# Patient Record
Sex: Male | Born: 2000 | Race: Black or African American | Hispanic: No | Marital: Single | Smoking: Never smoker
Health system: Southern US, Community
[De-identification: ages and names within clinical notes are randomized; demographics above are authoritative.]

## PROBLEM LIST (undated history)

## (undated) DIAGNOSIS — G4730 Sleep apnea, unspecified: Secondary | ICD-10-CM

## (undated) DIAGNOSIS — J45909 Unspecified asthma, uncomplicated: Secondary | ICD-10-CM

## (undated) DIAGNOSIS — H5210 Myopia, unspecified eye: Secondary | ICD-10-CM

## (undated) DIAGNOSIS — R6250 Unspecified lack of expected normal physiological development in childhood: Secondary | ICD-10-CM

## (undated) DIAGNOSIS — E301 Precocious puberty: Secondary | ICD-10-CM

## (undated) DIAGNOSIS — J302 Other seasonal allergic rhinitis: Secondary | ICD-10-CM

## (undated) DIAGNOSIS — E669 Obesity, unspecified: Secondary | ICD-10-CM

## (undated) DIAGNOSIS — R7303 Prediabetes: Secondary | ICD-10-CM

## (undated) DIAGNOSIS — T7840XA Allergy, unspecified, initial encounter: Secondary | ICD-10-CM

## (undated) DIAGNOSIS — J45901 Unspecified asthma with (acute) exacerbation: Secondary | ICD-10-CM

## (undated) DIAGNOSIS — F800 Phonological disorder: Secondary | ICD-10-CM

## (undated) DIAGNOSIS — Z734 Inadequate social skills, not elsewhere classified: Secondary | ICD-10-CM

## (undated) HISTORY — DX: Allergy, unspecified, initial encounter: T78.40XA

## (undated) HISTORY — DX: Precocious puberty: E30.1

## (undated) HISTORY — DX: Inadequate social skills, not elsewhere classified: Z73.4

## (undated) HISTORY — DX: Unspecified lack of expected normal physiological development in childhood: R62.50

## (undated) HISTORY — DX: Sleep apnea, unspecified: G47.30

## (undated) HISTORY — DX: Obesity, unspecified: E66.9

## (undated) HISTORY — DX: Phonological disorder: F80.0

## (undated) HISTORY — DX: Unspecified asthma with (acute) exacerbation: J45.901

## (undated) HISTORY — PX: ADENOIDECTOMY: SUR15

## (undated) HISTORY — DX: Myopia, unspecified eye: H52.10

## (undated) HISTORY — PX: TONSILECTOMY, ADENOIDECTOMY, BILATERAL MYRINGOTOMY AND TUBES: SHX2538

---
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
## 2020-01-01 ENCOUNTER — Telehealth (INDEPENDENT_AMBULATORY_CARE_PROVIDER_SITE_OTHER): Payer: Medicaid Other | Admitting: Primary Care

## 2020-01-01 ENCOUNTER — Telehealth: Payer: Self-pay | Admitting: Primary Care

## 2020-01-01 DIAGNOSIS — E669 Obesity, unspecified: Secondary | ICD-10-CM | POA: Diagnosis not present

## 2020-01-01 DIAGNOSIS — G4710 Hypersomnia, unspecified: Secondary | ICD-10-CM

## 2020-01-01 DIAGNOSIS — G4733 Obstructive sleep apnea (adult) (pediatric): Secondary | ICD-10-CM

## 2020-01-01 NOTE — Telephone Encounter (Signed)
Please order patient a chip strap Needs follow-up in 4 weeks televisit with download

## 2020-01-01 NOTE — Progress Notes (Signed)
Virtual Visit via Telephone Note  I connected with Bob [name] on 01/01/20 at 11:30 AM EDT by telephone and verified that I am speaking with the correct person using two identifiers.  Location: Patient: Home Provider: Office   I discussed the limitations, risks, security and privacy concerns of performing an evaluation and management service by telephone and the availability of in person appointments. I also discussed with the patient that there may be a patient responsible charge related to this service. The patient expressed understanding and agreed to proceed.   History of Present Illness: 19 year old male, never smoked. PMH significant for obstructive sleep apnea, mild intermittent asthma and allergies. Patient of Dr. Craige Cotta, last seen on 07/02/20. He has had difficulty with BIPAP mask fit and air leaks. Pressure adjusted to Max IPAP 16, min EPAP 10 CM H20, ps 4CM H20. Ordered for mask re-fitting. Added modafinil 100mg  daily for hypersomnia with OSA. Continue prn albuterol. floanse and zyrtec. Strongly encourage weight loss, given contact information for healthy weight and wellness.   01/01/2020 Patient contacted today for virtual visit. He just received nasal pillow mask 3 weeks ago. Previously was using full face mask, changed d/t air leaks. He is compliant with BIPAP wearing it 29/30 days. He sleeps well at night, his mask may occasionally come off at night but his mother reports that most of the time he is wearing it when he is sleeping. He is tolerating provigil 100mg  once daily. His mother reports improvement in daytime fatigue. He had lost some weight recently but gained it back. He did see healthy weight and wellness. He has not had any shortness of breath or wheezing. He has not needed to use albuterol rescue inhaler.   [facility] download 12/02/19-12/31/19: Usage 29/30 days; 83% > 4 hours Average time used 5 hours 56 mins Pressure Max IPAP 16cm h20, Min EPAP 11cm h30, PS 4 Airleaks  median 35.3L/min (95% 103.09 L/min) Apnea central 0,1, obstructive 9.7, unknown 13 AHI 31.1   Observations/Objective:  - Spoke with mother on video/televisit   Sleep reports:  PSG 03/27/13 >> AHI 6.3  Auto Bipap 06/02/19 to 07/01/19 >> used on 26 of 30 nights with average 3 hrs 51 min.  Average AHI 47.7 with median Bipap 15/11 and 95 th percentile Bipap 16/12 cm H2O.  Air leak  Assessment and Plan:  OSA: - He is 100% compliant with BIPAP use, tolerating nasal mask. Still experiencing large amount of air leaks. He is still having moderate amount of events but overall it is improved from his originally sleep study in 2020 that showed his AHI was 85.3/hr  - Recommend adding chin strap - Pressure  Max IPAP 16, Min EPAP 11, PS 4 cm h20; AHI 31.1/hr  - No pressure changes today, will follow-up in 4 weeks after he gets chin strap to use with nasal pillow mask   Hypersomnolence with OSA: - Improved - Continue Provigil 100mg  qd  Obesity: - Continue to encourage weight loss  - Recommend patient return to healthy weight and wellness   Follow Up Instructions:   - FU in 4 weeks with download  I discussed the assessment and treatment plan with the patient. The patient was provided an opportunity to ask questions and all were answered. The patient agreed with the plan and demonstrated an understanding of the instructions.   The patient was advised to call back or seek an in-person evaluation if the symptoms worsen or if the condition fails to improve as anticipated.  I provided 22 minutes of non-face-to-face time during this encounter.   Martyn Ehrich, NP

## 2020-01-01 NOTE — Patient Instructions (Addendum)
Recommendations: Continue BIPAP every night for minimum 4-6 hours or more Continue Provigil 100mg  daily for hypersomnolence   Orders: Chin strap to use with nasal pillow mask  Follow-up: 4 weeks televisit with download

## 2020-01-01 NOTE — Progress Notes (Signed)
Reviewed and agree with assessment/plan.   Neria Procter, MD [facility] 01/01/2020, 1:45 PM Pager:  [phone]  

## 2020-01-03 ENCOUNTER — Other Ambulatory Visit: Payer: Self-pay | Admitting: Pediatrics

## 2020-01-03 DIAGNOSIS — K219 Gastro-esophageal reflux disease without esophagitis: Secondary | ICD-10-CM

## 2020-01-14 ENCOUNTER — Other Ambulatory Visit: Payer: Self-pay

## 2020-01-14 MED ORDER — MODAFINIL 100 MG PO TABS: 100.0000 mg | ORAL_TABLET | Freq: Every day | ORAL | 5 refills | Status: DC

## 2020-01-14 NOTE — Telephone Encounter (Signed)
Patient is needing refill on Provigil 100 mg.  Last OV/video visit -01/01/20 with BW Told to continue on medication. Last refilled on  07/03/19  Beth please advise.

## 2020-01-24 NOTE — Telephone Encounter (Signed)
Order placed for chin strap.  Attempted to call pt to get f/u appt scheduled but unable to reach and unable to leave a VM. Routing to front desk to help out with getting appt scheduled.

## 2020-01-28 NOTE — Telephone Encounter (Signed)
Attempted to call pt to schedule f/u - vm not set up - pr

## 2020-02-04 NOTE — Telephone Encounter (Signed)
Called and scheduled patient with Beth on 02/29/2020-pr

## 2020-02-18 ENCOUNTER — Telehealth: Payer: Self-pay | Admitting: Pediatrics

## 2020-02-18 NOTE — Telephone Encounter (Signed)
I spoke with mom: they have lost Medicaid care and need Bob [name]'s ID number in order to be seen at [facility]. Mom does not remember changing Bob [name]'s Medicaid to a managed care plan. I gave her most recent Medicaid number that we have on file, but encouraged her to call Medicaid asap.

## 2020-02-18 NOTE — Telephone Encounter (Signed)
Mother wanting to talk to provider about the referral that had been made, I was unclear what she was asking. We may contact the mother at [phone] with more information.

## 2020-02-18 NOTE — Telephone Encounter (Signed)
I tried contacting mom in regards to a referral but lvm. There is no referral that was placed for me to complete the last referral placed is for [facility]. Baxter Hire would you be able to assist this mom in regards to that referral?

## 2020-02-29 ENCOUNTER — Ambulatory Visit: Payer: Medicaid Other | Admitting: Primary Care

## 2020-02-29 ENCOUNTER — Other Ambulatory Visit: Payer: Self-pay

## 2020-02-29 ENCOUNTER — Encounter: Payer: Self-pay | Admitting: Primary Care

## 2020-02-29 VITALS — BP 130/86 | HR 78 | Temp 98.0°F | Ht 66.0 in | Wt 355.6 lb

## 2020-02-29 DIAGNOSIS — R400 Somnolence: Secondary | ICD-10-CM | POA: Diagnosis not present

## 2020-02-29 DIAGNOSIS — G4733 Obstructive sleep apnea (adult) (pediatric): Secondary | ICD-10-CM

## 2020-02-29 NOTE — Assessment & Plan Note (Signed)
>>  ASSESSMENT AND PLAN FOR DAYTIME SOMNOLENCE WRITTEN ON 02/29/2020  2:35 PM BY WALSH, ALMARIE ORN, NP  - Patient continues to have moderate daytimes fatigue despite compliance with BIPAP use. [place] 10/24 today. Recommend BIPAP titration and continue Modafinil  100mg  daily.

## 2020-02-29 NOTE — Progress Notes (Signed)
 @Patient ID: Bob [name], male    DOB: 07/07/2001, 19 y.o.   MRN: [id number]  Chief Complaint  Patient presents with  . Follow-up    still snoring, leg swelling    Referring provider: No ref. provider found  HPI: 19 year old male, never smoked. PMH significant for obstructive sleep apnea, mild intermittent asthma and allergies. Patient of Dr. Sood, last seen on 07/02/20. He has had difficulty with BIPAP mask fit and air leaks. Pressure adjusted to Max IPAP 16, min EPAP 10 CM H20, ps 4CM H20. Ordered for mask re-fitting. Added modafinil 100mg daily for hypersomnia with OSA. Continue prn albuterol. floanse and zyrtec. Strongly encourage weight loss, given contact information for healthy weight and wellness.   Previous [facility] pulmonary encounters:  01/01/2020 Patient contacted today for virtual visit. He just received nasal pillow mask 3 weeks ago. Previously was using full face mask, changed d/t air leaks. He is compliant with BIPAP wearing it 29/30 days. He sleeps well at night, his mask may occasionally come off at night but his mother reports that most of the time he is wearing it when he is sleeping. He is tolerating provigil 100mg once daily. His mother reports improvement in daytime fatigue. He had lost some weight recently but gained it back. He did see healthy weight and wellness. He has not had any shortness of breath or wheezing. He has not needed to use albuterol rescue inhaler.  Airview download 12/02/19-12/31/19: Usage 29/30 days; 83% > 4 hours Average time used 5 hours 56 mins Pressure Max IPAP 16cm h20, Min EPAP 11cm h30, PS 4 Airleaks median 35.3L/min (95% 103.09 L/min) Apnea central 0,1, obstructive 9.7, unknown 13 AHI 31.1   02/29/2020-Interim history Patient presents today for a 2-month follow-up OSA/asthma. He received new BIPAP mask 1-2 months ago, he was never sent chin strap. States that he was at [facility] [place] [facility] store and decided he wanted to try full face mask. He is  still taking Provigil 100mg at 6am.  He works as a seamstress, tasks include lifting boxes and labeling. He will occasionally take a nap. He does not currently have PCP, has started filling paper work. Complains of left knee pain. Epworth 10/24.   Airview download 01/30/20- 02/28/20: 26/30 days used (87%); 18 days (60%) > 4 hours Average usage 4 hours 54 mins Pressure Max IPAP 16cm h20, Min EPAP 11cm h30, PS 4 Airleaks 107.9L/min AHI 40.4    Allergies  Allergen Reactions  . Black Walnut Flavor Other (See Comments)    Asphyxiation    Immunization History  Administered Date(s) Administered  . DTaP 09/13/2000, 11/25/2000, 02/08/2001, 10/25/2001, 09/30/2004  . HPV 9-valent 08/07/2014  . HPV Quadrivalent 08/31/2011, 11/17/2012  . Hepatitis A 12/14/2005, 02/21/2007  . Hepatitis B 06/28/2001, 02/08/2001, 10/12/2005  . HiB (PRP-OMP) 09/13/2000, 11/25/2000, 02/08/2001, 10/25/2001  . IPV 09/13/2000, 11/25/2000, 08/02/2001, 09/30/2004  . Influenza Nasal 08/31/2011  . Influenza Split 05/29/2005, 05/12/2006, 05/13/2007, 04/16/2008, 04/22/2009, 04/28/2010, 05/02/2019  . Influenza,inj,Quad PF,6+ Mos 08/22/2015, 04/01/2016, 07/05/2017  . Influenza,inj,quad, With Preservative 08/07/2014  . MMR 08/02/2001, 09/30/2004  . Meningococcal Conjugate 08/31/2011, 01/04/2017  . Pneumococcal Conjugate-13 09/13/2000, 11/25/2000, 02/08/2001, 08/02/2002  . Tdap 08/31/2011  . Varicella 08/02/2001, 12/14/2005    Past Medical History:  Diagnosis Date  . Asthma 2007  . Asthma exacerbation 11/17/12  . Development delay    Karyotype normal, fragile X normal  . Early puberty, male 2009   at 7 years, familial  . Impaired speech articulation 2011  .   Inadequate social skills 2011  . Myopia 2010  . 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 Obesity 2010  . Seasonal allergies 2007    Tobacco History: Social History   Tobacco Use  Smoking Status Never Smoker  Smokeless Tobacco  Never Used   Counseling given: Not Answered   Outpatient Medications Prior to Visit  Medication Sig Dispense Refill  . cetirizine (ZYRTEC) 10 MG tablet TAKE 1 TABLET BY MOUTH EVERY NIGHT AT BEDTIME AS NEEDED FOR ALLERGIES 31 tablet 10  . modafinil (PROVIGIL) 100 MG tablet Take 1 tablet (100 mg total) by mouth daily. 30 tablet 5  . Multiple Vitamin (MULTIVITAMIN WITH MINERALS) TABS tablet Take 1 tablet by mouth daily.    . famotidine (PEPCID) 20 MG tablet Please take once a day for 2 weeks, then every other day for a week, then stop 45 tablet 0  . fluticasone (FLONASE) 50 MCG/ACT nasal spray Place 1 spray into both nostrils daily. (Patient not taking: Reported on 02/29/2020) 16 g 10   No facility-administered medications prior to visit.    Review of Systems  Review of Systems  Constitutional: Positive for fatigue.  Respiratory: Negative.   Musculoskeletal: Positive for arthralgias.    Physical Exam  BP 130/86 (BP Location: Left Arm, Cuff Size: Large)   Pulse 78   Temp 98 F (36.7 C) (Temporal)   Ht 5' 6" (1.676 m)   Wt (!) 355 lb 9.6 oz (161.3 kg)   SpO2 99% Comment: Room air  BMI 57.40 kg/m  Physical Exam Constitutional:      General: He is not in acute distress.    Appearance: Normal appearance. He is obese. He is not ill-appearing.  HENT:     Head: Normocephalic and atraumatic.     Mouth/Throat:     Mouth: Mucous membranes are moist.     Pharynx: Oropharynx is clear.     Comments: Mallampati class III Cardiovascular:     Rate and Rhythm: Normal rate and regular rhythm.  Pulmonary:     Effort: Pulmonary effort is normal.     Breath sounds: Normal breath sounds.  Musculoskeletal:        General: No deformity. Normal range of motion.  Neurological:     General: No focal deficit present.     Mental Status: He is alert and oriented to person, place, and time. Mental status is at baseline.  Psychiatric:        Mood and Affect: Mood normal.        Behavior: Behavior  normal.        Thought Content: Thought content normal.        Judgment: Judgment normal.      Lab Results:  CBC    Component Value Date/Time   WBC 7.0 06/22/2019 1825   RBC 4.88 06/22/2019 1825   HGB 13.9 06/22/2019 1825   HCT 43.1 06/22/2019 1825   PLT 249 06/22/2019 1825   MCV 88.3 06/22/2019 1825   MCH 28.5 06/22/2019 1825   MCHC 32.3 06/22/2019 1825   RDW 13.4 06/22/2019 1825   LYMPHSABS 2.2 06/22/2019 1825   MONOABS 0.6 06/22/2019 1825   EOSABS 0.1 06/22/2019 1825   BASOSABS 0.0 06/22/2019 1825    BMET    Component Value Date/Time   NA 140 06/22/2019 1825   K 3.8 06/22/2019 1825   CL 105 06/22/2019 1825   CO2 24 06/22/2019 1825   GLUCOSE 91 06/22/2019 1825   BUN   10 06/22/2019 1825   CREATININE 0.98 06/22/2019 1825   CALCIUM 9.6 06/22/2019 1825   GFRNONAA >60 06/22/2019 1825   GFRAA >60 06/22/2019 1825    BNP    Component Value Date/Time   BNP 17.3 06/22/2019 1822    ProBNP No results found for: PROBNP  Imaging: No results found.   Assessment & Plan:   Obstructive sleep apnea - Patient is 87% compliant with CPAP use, experiencing large amount of airleaks. He has tried nasal and full face mask. Airview download showed that patient is still having significant events despite BIPAP use, recommend in-lab titration study to readjust pressure/mask fit  - Current Pressure Max IPAP 16cm h20, Min EPAP 11cm h30, PS 4 - AHI 40.4  - Orders: CPAP titration study re: severe OSA - Encourage patient continue to work on weight loss efforts - Follow-up: 3-4 months with Dr. Halford Chessman  Daytime somnolence - Patient continues to have moderate daytimes fatigue despite compliance with BIPAP use. Epworth 10/24 today. Recommend BIPAP titration and continue Modafinil 166m daily.    EMartyn Ehrich NP 02/29/2020

## 2020-02-29 NOTE — Assessment & Plan Note (Signed)
-   Patient continues to have moderate daytimes fatigue despite compliance with BIPAP use. Epworth 10/24 today. Recommend BIPAP titration and continue Modafinil 100mg  daily.

## 2020-02-29 NOTE — Assessment & Plan Note (Addendum)
-   Patient is 87% compliant with CPAP use, experiencing large amount of airleaks. He has tried nasal and full face mask. Airview download showed that patient is still having significant events despite BIPAP use, recommend in-lab titration study to readjust pressure/mask fit  - Current Pressure Max IPAP 16cm h20, Min EPAP 11cm h30, PS 4 - AHI 40.4  - Orders: CPAP titration study re: severe OSA - Encourage patient continue to work on weight loss efforts - Follow-up: 3-4 months with Dr. Craige Cotta

## 2020-02-29 NOTE — Patient Instructions (Addendum)
Nice seeing you today [name] download showed that you are still having significant events despite BIPAP use, recommend in-lab CPAP titration study to readjust pressure/mask fit   Orders: CPAP titration study re: severe OSA  Recommendations: Continue Modafinil 100mg  daily  Continue to work on weight loss efforts  Follow-up: 3-4 months with Dr.     Sleep Apnea Sleep apnea is a condition in which breathing pauses or becomes shallow during sleep. Episodes of sleep apnea usually last 10 seconds or longer, and they may occur as many as 20 times an hour. Sleep apnea disrupts your sleep and keeps your body from getting the rest that it needs. This condition can increase your risk of certain health problems, including:  Heart attack.  Stroke.  Obesity.  Diabetes.  Heart failure.  Irregular heartbeat. What are the causes? There are three kinds of sleep apnea:  Obstructive sleep apnea. This kind is caused by a blocked or collapsed airway.  Central sleep apnea. This kind happens when the part of the brain that controls breathing does not send the correct signals to the muscles that control breathing.  Mixed sleep apnea. This is a combination of obstructive and central sleep apnea. The most common cause of this condition is a collapsed or blocked airway. An airway can collapse or become blocked if:  Your throat muscles are abnormally relaxed.  Your tongue and tonsils are larger than normal.  You are overweight.  Your airway is smaller than normal. What increases the risk? You are more likely to develop this condition if you:  Are overweight.  Smoke.  Have a smaller than normal airway.  Are elderly.  Are male.  Drink alcohol.  Take sedatives or tranquilizers.  Have a family history of sleep apnea. What are the signs or symptoms? Symptoms of this condition include:  Trouble staying asleep.  Daytime sleepiness and  tiredness.  Irritability.  Loud snoring.  Morning headaches.  Trouble concentrating.  Forgetfulness.  Decreased interest in sex.  Unexplained sleepiness.  Mood swings.  Personality changes.  Feelings of depression.  Waking up often during the night to urinate.  Dry mouth.  Sore throat. How is this diagnosed? This condition may be diagnosed with:  A medical history.  A physical exam.  A series of tests that are done while you are sleeping (sleep study). These tests are usually done in a sleep lab, but they may also be done at home. How is this treated? Treatment for this condition aims to restore normal breathing and to ease symptoms during sleep. It may involve managing health issues that can affect breathing, such as high blood pressure or obesity. Treatment may include:  Sleeping on your side.  Using a decongestant if you have nasal congestion.  Avoiding the use of depressants, including alcohol, sedatives, and narcotics.  Losing weight if you are overweight.  Making changes to your diet.  Quitting smoking.  Using a device to open your airway while you sleep, such as: ? An oral appliance. This is a custom-made mouthpiece that shifts your lower jaw forward. ? A continuous positive airway pressure (CPAP) device. This device blows air through a mask when you breathe out (exhale). ? A nasal expiratory positive airway pressure (EPAP) device. This device has valves that you put into each nostril. ? A bi-level positive airway pressure (BPAP) device. This device blows air through a mask when you breathe in (inhale) and breathe out (exhale).  Having surgery if other treatments do not  work. During surgery, excess tissue is removed to create a wider airway. It is important to get treatment for sleep apnea. Without treatment, this condition can lead to:  High blood pressure.  Coronary artery disease.  In men, an inability to achieve or maintain an erection  (impotence).  Reduced thinking abilities. Follow these instructions at home: Lifestyle  Make any lifestyle changes that your health care provider recommends.  Eat a healthy, well-balanced diet.  Take steps to lose weight if you are overweight.  Avoid using depressants, including alcohol, sedatives, and narcotics.  Do not use any products that contain nicotine or tobacco, such as cigarettes, e-cigarettes, and chewing tobacco. If you need help quitting, ask your health care provider. General instructions  Take over-the-counter and prescription medicines only as told by your health care provider.  If you were given a device to open your airway while you sleep, use it only as told by your health care provider.  If you are having surgery, make sure to tell your health care provider you have sleep apnea. You may need to bring your device with you.  Keep all follow-up visits as told by your health care provider. This is important. Contact a health care provider if:  The device that you received to open your airway during sleep is uncomfortable or does not seem to be working.  Your symptoms do not improve.  Your symptoms get worse. Get help right away if:  You develop: ? Chest pain. ? Shortness of breath. ? Discomfort in your back, arms, or stomach.  You have: ? Trouble speaking. ? Weakness on one side of your body. ? Drooping in your face. These symptoms may represent a serious problem that is an emergency. Do not wait to see if the symptoms will go away. Get medical help right away. Call your local emergency services (911 in the [place] Do not drive yourself to the hospital. Summary  Sleep apnea is a condition in which breathing pauses or becomes shallow during sleep.  The most common cause is a collapsed or blocked airway.  The goal of treatment is to restore normal breathing and to ease symptoms during sleep. This information is not intended to replace advice given to you  by your health care provider. Make sure you discuss any questions you have with your health care provider. Document Revised: 12/20/2018 Document Reviewed: 02/28/2018 Elsevier Patient Education  2020 [employer].

## 2020-03-03 NOTE — Progress Notes (Signed)
Reviewed and agree with assessment/plan.   Coralyn Helling, MD [facility] Pulmonary/Critical Care 03/03/2020, 8:32 AM Pager:  [phone]

## 2020-03-06 NOTE — Telephone Encounter (Signed)
TC to mom, unable to LVM.

## 2020-03-19 ENCOUNTER — Telehealth: Payer: Self-pay | Admitting: Pulmonary Disease

## 2020-03-19 NOTE — Telephone Encounter (Signed)
Assessment & Plan:   Obstructive sleep apnea - Patient is 87% compliant with CPAP use, experiencing large amount of airleaks. He has tried nasal and full face mask. Airview download showed that patient is still having significant events despite BIPAP use, recommend in-lab titration study to readjust pressure/mask fit  - Current Pressure Max IPAP 16cm h20, Min EPAP 11cm h30, PS 4 - AHI 40.4  - Orders: CPAP titration study re: severe OSA - Encourage patient continue to work on weight loss efforts - Follow-up: 3-4 months with Dr. Craige Cotta  Daytime somnolence - Patient continues to have moderate daytimes fatigue despite compliance with BIPAP use. Epworth 10/24 today. Recommend BIPAP titration and continue Modafinil 100mg  daily.    Called and spoke with [facility] in regards to OV note from [facility] and he verbalized understanding. Nothing further needed.

## 2020-03-21 ENCOUNTER — Other Ambulatory Visit: Payer: Medicaid Other

## 2020-03-21 ENCOUNTER — Other Ambulatory Visit: Payer: Self-pay

## 2020-03-21 DIAGNOSIS — Z20822 Contact with and (suspected) exposure to covid-19: Secondary | ICD-10-CM

## 2020-03-23 LAB — NOVEL CORONAVIRUS, NAA: SARS-CoV-2, NAA: NOT DETECTED

## 2020-03-25 ENCOUNTER — Ambulatory Visit (HOSPITAL_BASED_OUTPATIENT_CLINIC_OR_DEPARTMENT_OTHER): Payer: Medicaid Other | Attending: Primary Care | Admitting: Pulmonary Disease

## 2020-03-25 ENCOUNTER — Other Ambulatory Visit: Payer: Self-pay

## 2020-05-16 ENCOUNTER — Encounter: Payer: Self-pay | Admitting: Family Medicine

## 2020-05-16 ENCOUNTER — Ambulatory Visit (INDEPENDENT_AMBULATORY_CARE_PROVIDER_SITE_OTHER): Payer: Medicaid Other | Admitting: Family Medicine

## 2020-05-16 ENCOUNTER — Other Ambulatory Visit: Payer: Self-pay

## 2020-05-16 DIAGNOSIS — L859 Epidermal thickening, unspecified: Secondary | ICD-10-CM | POA: Diagnosis present

## 2020-05-16 HISTORY — DX: Epidermal thickening, unspecified: L85.9

## 2020-05-16 NOTE — Progress Notes (Addendum)
SUBJECTIVE:   CHIEF COMPLAINT / HPI:   Bob [name] (MRN: [id number]) is a 19 y.o. male with a history of asthma, OSA, GERD, and seasonal allergies who presents today to establish care and for evaluation of dry skin and discoloration of the bilateral extremities. He was accompanied today with his grandmother and niece.  Dry skin and discoloration of bilateral extremities Patient reports having very dry legs with hyperpigmentation for the last 8-9 months. He also has dry skin on his arms, hands, and feet. He states that he sometimes picks at it due to it being flaky. He has tried [employer] to moisturize the area, and the flakiness improves slightly, but it ultimately comes back after stopping use. He has also tried triamcinolone that his father has with minimal improvement. He denies any pain. He denies any bleeding unless he picks at it too much. It otherwise does not bother him at all.  PERTINENT  PMH / PSH:   He has had asthma since elementary school, and it is controlled with albuterol inhaler prn. He has had sleep apnea since high school, and it is controlled with BPAP and provigil, but he is having a sleep study done on 05/20/2020 for further evaluation. He has not had a flare up of GERD since stopping famotidine. He takes one tablet of cetirizine and two sprays of fluticasone daily for allergies. He also has an allergy to black walnuts. He takes vitafusion multivitamin and nature's bounty vitamin D3 daily. He states all of his vaccines are in the [facility] registry. He had an adenoidectomy and tonsillectomy in 2004.  His grandparents have a history of Alzhemier's disease, heart disease, depression/suicide/anxiety, diabetes, HLD, HTN, kidney disease, and stroke. His aunts/uncles have a history of depression/suicide/anxiety.  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 He lives with his mother and  older brother, and his grandmother helps care for him. His family brings him to appointments. He states that he has an advanced directive, and that his mother or grandmother should make medical decisions for him should he be unable to do so. He enjoys playing video games, watching YouTube videos, and reading. He does not use tobacco products, alcohol, or recreational drugs. He denies regular exercise but endorses a diet of low sodium meats and vegetables. He is not sexually active.  OBJECTIVE:   BP 128/64   Pulse (!) 110   Ht 5\' 6"  (1.676 m)   Wt (!) 363 lb (164.7 kg)   SpO2 95%   BMI 58.59 kg/m    PHYSICAL EXAM  GEN: well developed, well-nourished, in NAD HEAD: NCAT, neck supple  EENT:  PERRL, TM clear bilaterally, pink nasal mucosa, MMM without erythema, lesions, or exudates CVS: RRR, normal S1/S2, no murmurs, rubs, gallops RESP: Breathing comfortably on RA, no retractions, wheezes, rhonchi, or crackles ABD: soft, non-tender, no organomegaly or masses SKIN: hyperkeratosis of bilateral extremities; dry skin on arms, hands, and feet EXT: Moves all extremities equally, no lymphedema   ASSESSMENT/PLAN:   Hyperkeratosis Patient has had symptoms for the last 8-9 months with only minimal improvement with intermittent use of Bag Balm and triamcinolone. Counseled patient on continuing to use Bag Balm or try another agent such as petroleum jelly or Aquaphor on a regular basis to improve dry skin and buildup. Can also use on arms, hands, and feet. If no improvement after 4-6 weeks, return for further  evaluation.   Samantha Crimes, Medical Student [facility]  Resident Attestation  I saw and evaluated the patient, performing the key elements of the service.I  personally performed or re-performed the history, physical exam, and medical decision making activities of this service and have verified that the service and findings are accurately documented in the student's note.  I developed the management plan that is described in the medical student's note, and I agree with the content, with my edits above.    Derrel Nip, PGY2

## 2020-05-16 NOTE — Assessment & Plan Note (Signed)
Patient has had symptoms for the last 8-9 months with only minimal improvement with intermittent use of Bag Balm and triamcinolone. Counseled patient on continuing to use Bag Balm or try another agent such as petroleum jelly or Aquaphor on a regular basis to improve dry skin and buildup. Can also use on arms, hands, and feet. If no improvement after 4-6 weeks, return for further evaluation.

## 2020-05-16 NOTE — Patient Instructions (Signed)
It was great seeing you today! We are happy to have you here at our clinic. Your physical looked good. For your dry skin, continue to use the Bag Balm if it works for you. You can also use others such as petroleum jelly or aquaphor to ensure you are getting moisture to your skin. If you have worsening symptoms, please call us back. Have a great rest of your day!

## 2020-05-20 ENCOUNTER — Ambulatory Visit (HOSPITAL_BASED_OUTPATIENT_CLINIC_OR_DEPARTMENT_OTHER): Payer: Medicaid Other | Attending: Primary Care | Admitting: Pulmonary Disease

## 2020-05-20 ENCOUNTER — Other Ambulatory Visit: Payer: Self-pay

## 2020-05-20 DIAGNOSIS — G4733 Obstructive sleep apnea (adult) (pediatric): Secondary | ICD-10-CM | POA: Diagnosis present

## 2020-05-21 ENCOUNTER — Telehealth: Payer: Self-pay | Admitting: Pulmonary Disease

## 2020-05-21 DIAGNOSIS — G4733 Obstructive sleep apnea (adult) (pediatric): Secondary | ICD-10-CM

## 2020-05-21 NOTE — Telephone Encounter (Signed)
Bipap 05/20/20 >> Bipap 30/25 cm H2O >> AHI 12.8.  Didn't need supplemental oxygen.   Please have his DME change his Bipap to 30/25 cm H2O and schedule him for an ROV in 2 months.  If his Bipap is not able to be set that high, then please let me know and we would then need to look into getting him set up with a Trilogy or Astral home ventilator.

## 2020-05-21 NOTE — Procedures (Signed)
    Patient Name: Bob [name], Bob [name] Date: 05/20/2020 Gender: Male D.O.B: Nov 01, 2000 Age (years): 19 Referring Provider: Ames Dura NP Height (inches): 66 Interpreting Physician: Coralyn Helling MD, ABSM Weight (lbs): 363 RPSGT: Shelah Lewandowsky BMI: 59 MRN: [id number] Neck Size: 20.00  CLINICAL INFORMATION The patient is referred for a BiPAP titration to treat sleep apnea.  SLEEP STUDY TECHNIQUE As per the AASM Manual for the Scoring of Sleep and Associated Events v2.3 (April 2016) with a hypopnea requiring 4% desaturations.  The channels recorded and monitored were frontal, central and occipital EEG, electrooculogram (EOG), submentalis EMG (chin), nasal and oral airflow, thoracic and abdominal wall motion, anterior tibialis EMG, snore microphone, electrocardiogram, and pulse oximetry. Bilevel positive airway pressure (BPAP) was initiated at the beginning of the study and titrated to treat sleep-disordered breathing.  MEDICATIONS Medications self-administered by patient taken the night of the study : N/A  RESPIRATORY PARAMETERS Optimal IPAP Pressure (cm): 30 AHI at Optimal Pressure (/hr) 12.8 Optimal EPAP Pressure (cm): 25   Overall Minimal O2 (%): 64.0 Minimal O2 at Optimal Pressure (%): 89.0  SLEEP ARCHITECTURE Start Time: 10:34:29 PM Stop Time: 4:54:24 AM Total Time (min): 379.9 Total Sleep Time (min): 367 Sleep Latency (min): 1.3 Sleep Efficiency (%): 96.6% REM Latency (min): 139.5 WASO (min): 11.6 Stage N1 (%): 6.3% Stage N2 (%): 69.1% Stage N3 (%): 0.0% Stage R (%): 24.7 Supine (%): 85.69 Arousal Index (/hr): 52.8   CARDIAC DATA The 2 lead EKG demonstrated sinus rhythm. The mean heart rate was 76.0 beats per minute. Other EKG findings include: PVCs.  LEG MOVEMENT DATA The total Periodic Limb Movements of Sleep (PLMS) were 0. The PLMS index was 0.0. A PLMS index of <15 is considered normal in adults.  IMPRESSIONS - He was tried on CPAP but continued to have  obstructive respiratory events. - He was changed to Bipap.  He had improvement in his obstructive sleep apnea, but required very high Bipap settings.  Optimal pressure setting for Bipap was 30/25 cm H2O. - He did not require the use of supplemental oxygen during this study.  DIAGNOSIS - Obstructive Sleep Apnea (G47.33)  RECOMMENDATIONS - Trial of BiPAP therapy on 30/25 cm H2O with a Large size [employer] Full Face Mask Simplus mask and heated humidification.  [Electronically signed] 05/21/2020 09:29 AM  Coralyn Helling MD, ABSM Diplomate, American Board of Sleep Medicine   NPI: [id number]

## 2020-05-23 NOTE — Telephone Encounter (Signed)
Called and went over Bipap results per Dr Craige Cotta. All questions answered and patient expressed understanding and agreeable to order being placed to change Bipap settings per Dr Craige Cotta. Order to change Bipap settings to BIPAP ST 30/25 cm H20 with back up rate of 14 per verbal order from Dr Craige Cotta placed. Scheduled office visit with Dr Craige Cotta at the [facility] office for Friday, 08/08/2020 at 11:15am. Patient agreeable to time, date and location. Nothing further needed at this time.

## 2020-05-29 ENCOUNTER — Telehealth: Payer: Self-pay | Admitting: Pulmonary Disease

## 2020-05-29 NOTE — Telephone Encounter (Signed)
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

## 2020-05-29 NOTE — Telephone Encounter (Signed)
Called and spoke with Melissa from [employer] about the info stated by Dr. Craige Cotta. Melissa said she would try to run things through for pt to receive the BIPAP ST. Melissa stated that she did not think pt needed to have ABG done with pt receiving BIPAP ST but would call office if one needed to be done. Nothing further needed.

## 2020-05-29 NOTE — Telephone Encounter (Signed)
We have been working with [employer] all week on this issue.  Told previously he could get Bipap ST and order placed for this on 05/23/20.  Preference is to proceed with order and monitor his response.  If this set up doesn't work, then can consider changing to Trilogy or Astral home ventilator.

## 2020-07-25 ENCOUNTER — Other Ambulatory Visit: Payer: Self-pay | Admitting: Pulmonary Disease

## 2020-07-25 ENCOUNTER — Other Ambulatory Visit: Payer: Self-pay | Admitting: Primary Care

## 2020-08-08 ENCOUNTER — Other Ambulatory Visit: Payer: Self-pay

## 2020-08-08 ENCOUNTER — Encounter: Payer: Self-pay | Admitting: Pulmonary Disease

## 2020-08-08 ENCOUNTER — Ambulatory Visit (INDEPENDENT_AMBULATORY_CARE_PROVIDER_SITE_OTHER): Payer: Medicaid Other | Admitting: Pulmonary Disease

## 2020-08-08 VITALS — BP 126/88 | HR 87 | Temp 97.0°F | Ht 66.0 in | Wt 358.8 lb

## 2020-08-08 DIAGNOSIS — G4733 Obstructive sleep apnea (adult) (pediatric): Secondary | ICD-10-CM

## 2020-08-08 DIAGNOSIS — J4520 Mild intermittent asthma, uncomplicated: Secondary | ICD-10-CM | POA: Diagnosis not present

## 2020-08-08 DIAGNOSIS — J301 Allergic rhinitis due to pollen: Secondary | ICD-10-CM

## 2020-08-08 DIAGNOSIS — G4730 Sleep apnea, unspecified: Secondary | ICD-10-CM | POA: Diagnosis not present

## 2020-08-08 DIAGNOSIS — E669 Obesity, unspecified: Secondary | ICD-10-CM

## 2020-08-08 NOTE — Patient Instructions (Signed)
Will check with [employer] about how to get you set up for correct settings on your machine and call you once we get this figured out  Follow up in 6 months

## 2020-08-08 NOTE — Progress Notes (Signed)
Bob [name], Bob [name], Bob [name]  Chief Complaint  Patient presents with  . Follow-up    F/U after bipap setting change. States the mask will come off at times at night from tossing Bob turning.     Constitutional:  BP 126/88   Pulse 87   Temp (!) 97 F (36.1 C) (Temporal)   Ht 5\' 6"  (1.676 m)   Wt (!) 358 lb 12.8 oz (162.8 kg)   SpO2 100% Comment: on RA  BMI 57.91 kg/m   Past Medical History:  Asthma, Allergies, OSA  Past Surgical History:  He  has a past surgical history that includes Adenoidectomy; Tympanostomy tube placement (2004); Bob Tonsilectomy, adenoidectomy, bilateral myringotomy Bob tubes.  Brief Summary:  Bob [name] is a 20 y.o. male with obstructive sleep apnea, asthma, Bob allergies.      Subjective:   He had Bipap titration study.  Needed very high Bipap settings.  Ordered placed for Bipap ST but this was never set up.  He continues to use auto Bipap with previous settings Bob with this his AHI is 54.6!  He is a restless sleeper Bob mask keeps shifting.  He hasn't been able to do anything to try Bob get his weight down.  He frequently eats out at 26, especially when he is busy at work.  At Rowen's request, I spoke with his mother over the phone Bob updated about plan.  Physical Exam:   Appearance - well kempt   ENMT - no sinus tenderness, no oral exudate, no LAN, Mallampati 4 airway, no stridor  Respiratory - equal breath sounds bilaterally, no wheezing or rales  CV - s1s2 regular rate Bob rhythm, no murmurs  Ext - no clubbing, no edema  Skin - no rashes  Psych - normal mood Bob affect   Sleep Tests:   PSG 03/27/13 >> AHI 6.3  Auto Bipap 06/02/19 to 07/01/19 >> used on 26 of 30 nights with average 3 hrs 51 min.  Average AHI 47.7 with median Bipap 15/11 Bob 95 th percentile Bipap 16/12 cm H2O.  Air leak  Bipap 05/20/20 >> Bipap 30/25 cm H2O >> AHI 12.8.  Didn't need supplemental oxygen.  Auto  Bipap 07/08/20 to 08/06/20 >> used on 30 of 30 nights with average 6 hrs 9 min.  Average AHI 54.6 with median Bob 95 th percentile Bipap 16/12 cm H2O  Social History:  He  reports that he has never smoked. He has never used smokeless tobacco. He reports that he does not drink alcohol Bob does not use drugs.  Family History:  His family history includes Asthma in his brother, father, Bob sister; Birth defects in his brother; Cancer in his father; Depression in his father; Diabetes in his father Bob mother; Hyperlipidemia in his father Bob mother; Hypertension in his father.     Assessment/Plan:   Obstructive sleep apnea. - his recent titration study showed he needed very high pressure settings for Bipap - ordered placed for Bipap ST, but doesn't seem like he will be able to get this - uses [employer] for his DME - will have his DME change his current auto Bipap to max IPAP 25, min EPAP 15, PS 4 cm H2O Bob see if this improves his control - he might need to be switched to a home vent if he can't get better control with adjustment to auto Bipap Bob he can't get a Bipap ST set up - ultimately he might need tracheostomy if  he gets worse  Persistent daytime hypersomnia with obstructive sleep apnea. - continue provigil 100 mg daily  Mild, intermittent asthma Bob Allergic rhinitis. - prn flonase., zyrtec  Obesity. - he has not made progress with weight loss - explained how imperative this is to his overall health Bob control of his sleep apnea - he will work on diet modification   Time Spent Involved in Patient [name] on Day of Examination:  35 minutes  Follow up:  Patient Instructions  Will check with [employer] about how to get you set up for correct settings on your machine Bob call you once we get this figured out  Follow up in 6 months   Medication List:   Allergies as of 08/08/2020      Reactions   [facility] Flavor Other (See Comments)   Asphyxiation      Medication List        Accurate as of August 08, 2020 12:56 PM. If you have any questions, ask your nurse or doctor.        cetirizine 10 MG tablet Commonly known as: ZYRTEC TAKE 1 TABLET BY MOUTH EVERY NIGHT AT BEDTIME AS NEEDED FOR ALLERGIES   cholecalciferol 25 MCG (1000 UNIT) tablet Commonly known as: VITAMIN D3 Take 50 mcg by mouth daily.   famotidine 20 MG tablet Commonly known as: Pepcid Please take once a day for 2 weeks, then every other day for a week, then stop   fluticasone 50 MCG/ACT nasal spray Commonly known as: FLONASE Place 1 spray into both nostrils daily.   multivitamin with minerals Tabs tablet Take 1 tablet by mouth daily.   Provigil 100 MG tablet Generic drug: modafinil TAKE 1 TABLET (100 MG TOTAL) BY MOUTH DAILY.       Signature:  Coralyn Helling, MD [facility] [name] Pager - [phone] 08/08/2020, 12:56 PM

## 2020-10-01 ENCOUNTER — Other Ambulatory Visit: Payer: Self-pay | Admitting: Pediatrics

## 2020-10-01 DIAGNOSIS — J302 Other seasonal allergic rhinitis: Secondary | ICD-10-CM

## 2020-10-02 NOTE — Telephone Encounter (Signed)
Refill request received for Cetirizine  Last seen for allergies about one year ago  He is now seen in the family medicine clinic  We cannot refill the cetirizine  The family medicine clinic may refill it or they may request a visit.  Please call family to find out if they requested more medicine or if the request was an automatic request from Pharmacy.  Refill not approved.

## 2020-10-03 NOTE — Telephone Encounter (Signed)
Called and spoke with Bob [name]. Let him know refill request was sent to [place] from pharmacy. Advised he will need to send prescription refill request to [facility]. [name] stated understanding.

## 2020-10-14 ENCOUNTER — Telehealth: Payer: Self-pay | Admitting: Pulmonary Disease

## 2020-10-14 NOTE — Telephone Encounter (Signed)
Auto Bipap 09/09/20 to 10/08/20 >> used on 30 of 30 nights with average 6 hrs 23 min.  Average AHI 23 with median Bipap 19/23 and 95 th percentile Bipap 25/21 cm H2O.   Please let him know that current Bipap report shows better control of sleep apnea after settings changed in February.  He needs to keep up his compliance with Bipap and continue to work on getting his weight down.

## 2020-10-17 NOTE — Telephone Encounter (Signed)
Called and went over Bipap report per Dr Craige Cotta with patient. All questions answered and patient expressed full understanding of report and Dr. Evlyn Courier recommendations. Nothing further needed at this time.

## 2020-10-28 ENCOUNTER — Other Ambulatory Visit (HOSPITAL_COMMUNITY): Payer: Self-pay

## 2020-10-28 MED FILL — Modafinil Tab 100 MG: ORAL | 30 days supply | Qty: 30 | Fill #0 | Status: AC

## 2020-11-28 ENCOUNTER — Other Ambulatory Visit (HOSPITAL_COMMUNITY): Payer: Self-pay

## 2020-11-28 MED FILL — Modafinil Tab 100 MG: ORAL | 30 days supply | Qty: 30 | Fill #1 | Status: AC

## 2021-01-01 ENCOUNTER — Other Ambulatory Visit (HOSPITAL_COMMUNITY): Payer: Self-pay

## 2021-01-01 MED FILL — Modafinil Tab 100 MG: ORAL | 30 days supply | Qty: 30 | Fill #2 | Status: AC

## 2021-01-09 ENCOUNTER — Other Ambulatory Visit (HOSPITAL_COMMUNITY): Payer: Self-pay

## 2021-02-02 ENCOUNTER — Other Ambulatory Visit: Payer: Self-pay | Admitting: Pulmonary Disease

## 2021-02-02 ENCOUNTER — Other Ambulatory Visit (HOSPITAL_COMMUNITY): Payer: Self-pay

## 2021-02-02 NOTE — Telephone Encounter (Signed)
Last OV was 08/08/2020. Please advise.

## 2021-02-03 ENCOUNTER — Other Ambulatory Visit (HOSPITAL_COMMUNITY): Payer: Self-pay

## 2021-02-03 MED ORDER — MODAFINIL 100 MG PO TABS
100.0000 mg | ORAL_TABLET | Freq: Every day | ORAL | 5 refills | Status: DC
  Filled 2021-02-03 – 2021-08-01 (×4): qty 30, 30d supply, fill #0

## 2021-02-04 ENCOUNTER — Other Ambulatory Visit (HOSPITAL_COMMUNITY): Payer: Self-pay

## 2021-02-04 NOTE — Telephone Encounter (Signed)
Patient called and he voices understanding.

## 2021-02-05 ENCOUNTER — Other Ambulatory Visit (HOSPITAL_COMMUNITY): Payer: Self-pay

## 2021-02-19 ENCOUNTER — Other Ambulatory Visit (HOSPITAL_COMMUNITY): Payer: Self-pay

## 2021-08-01 ENCOUNTER — Other Ambulatory Visit (HOSPITAL_COMMUNITY): Payer: Self-pay

## 2021-09-02 ENCOUNTER — Other Ambulatory Visit: Payer: Self-pay

## 2021-09-02 ENCOUNTER — Encounter: Payer: Self-pay | Admitting: Family Medicine

## 2021-09-02 ENCOUNTER — Ambulatory Visit (INDEPENDENT_AMBULATORY_CARE_PROVIDER_SITE_OTHER): Payer: Medicaid Other | Admitting: Family Medicine

## 2021-09-02 DIAGNOSIS — S90112A Contusion of left great toe without damage to nail, initial encounter: Secondary | ICD-10-CM | POA: Insufficient documentation

## 2021-09-02 DIAGNOSIS — M545 Low back pain, unspecified: Secondary | ICD-10-CM | POA: Diagnosis not present

## 2021-09-02 MED ORDER — MODAFINIL 100 MG PO TABS: 100.0000 mg | ORAL_TABLET | Freq: Every day | ORAL | 5 refills | Status: DC

## 2021-09-02 NOTE — Assessment & Plan Note (Signed)
Pain most likely muscular in origin.  No red flag symptoms.  Physical exam was reassuring.  Discussed stretching, NSAIDs as needed.  Discussed strict return precautions.

## 2021-09-02 NOTE — Patient Instructions (Signed)
It was good seeing you today.  For your low back pain this is muscular and I recommend stretching.  Be sure to hold the stretches for at least 20 seconds.  You can also take Tylenol or ibuprofen if it is really bothering you.  Regarding your toenail concerns please be sure to trim your toenail and it will grow back.  If you have any questions or concerns call the clinic.  Hope you have a great day!

## 2021-09-02 NOTE — Progress Notes (Signed)
° ° °  SUBJECTIVE:   CHIEF COMPLAINT / HPI:   Low back pain Patient presents for evaluation for low back pain.  He reports that he mainly notices the pain when he lays down.  It is on his right lower back.  He reports pain with movement.  It is resolved at this time.  Does not notice it much when he is standing.  Denies any red flag symptoms such as loss of bowel or bladder function.  Denies any known injury to the low back.  Reports things that make it worse include when he is reaching to get up.  Has not taken any medications for it or stretched.  Left great toe concern Patient reports he had a blister on his left great toe for a few weeks and just wants it evaluated.  He denies any known injury to the left great toe.  Reports that it came up and look like a blister underneath the toenail and now his toenail is growing out.  OBJECTIVE:   BP 130/87    Pulse 79    Ht 5\' 6"  (1.676 m)    Wt (!) 377 lb 12.8 oz (171.4 kg)    SpO2 100%    BMI 60.98 kg/m   General: Pleasant 21 year old male, no acute distress Cardiac: Regular rate Respiratory: Normal for breathing MSK: No tenderness to lower back but able to locate where the pain previously occurred.  Full range of motion of the lumbar spine with extension, flexion, rotation, no bony tenderness Extremities: Patient with injured left great toe, healing hematoma under her left great toe nail    ASSESSMENT/PLAN:   Low back pain Pain most likely muscular in origin.  No red flag symptoms.  Physical exam was reassuring.  Discussed stretching, NSAIDs as needed.  Discussed strict return precautions.  Contusion of left great toe without damage to nail Appears to be healing well at this time.  Recommended trimming or filing the nail down but no other intervention needed at this time.  He will continue to monitor and return as needed.   36, MD [facility]

## 2021-09-02 NOTE — Assessment & Plan Note (Signed)
Appears to be healing well at this time.  Recommended trimming or filing the nail down but no other intervention needed at this time.  He will continue to monitor and return as needed.

## 2021-10-26 ENCOUNTER — Encounter: Payer: Self-pay | Admitting: Family Medicine

## 2021-10-26 ENCOUNTER — Ambulatory Visit (INDEPENDENT_AMBULATORY_CARE_PROVIDER_SITE_OTHER): Payer: Medicaid Other | Admitting: Family Medicine

## 2021-10-26 DIAGNOSIS — J4530 Mild persistent asthma, uncomplicated: Secondary | ICD-10-CM | POA: Diagnosis not present

## 2021-10-26 DIAGNOSIS — J302 Other seasonal allergic rhinitis: Secondary | ICD-10-CM

## 2021-10-26 DIAGNOSIS — G629 Polyneuropathy, unspecified: Secondary | ICD-10-CM

## 2021-10-26 DIAGNOSIS — K219 Gastro-esophageal reflux disease without esophagitis: Secondary | ICD-10-CM | POA: Diagnosis not present

## 2021-10-26 LAB — POCT GLYCOSYLATED HEMOGLOBIN (HGB A1C): Hemoglobin A1C: 5.8 % — AB (ref 4.0–5.6)

## 2021-10-26 MED ORDER — FAMOTIDINE 20 MG PO TABS: ORAL_TABLET | ORAL | 0 refills | Status: DC

## 2021-10-26 MED ORDER — VITAMIN D 25 MCG (1000 UNIT) PO TABS: 50.0000 ug | ORAL_TABLET | Freq: Every day | ORAL | 3 refills | Status: DC

## 2021-10-26 MED ORDER — MODAFINIL 100 MG PO TABS: 100.0000 mg | ORAL_TABLET | Freq: Every day | ORAL | 5 refills | Status: DC

## 2021-10-26 MED ORDER — FLUTICASONE PROPIONATE 50 MCG/ACT NA SUSP: 1.0000 | Freq: Every day | NASAL | 10 refills | Status: DC

## 2021-10-26 MED ORDER — CETIRIZINE HCL 10 MG PO TABS: ORAL_TABLET | ORAL | 10 refills | Status: DC

## 2021-10-26 NOTE — Patient Instructions (Addendum)
It was wonderful seeing you today.  I believe your symptoms are coming from compression of a nerve somewhere in your back.  There is no treatment that I need to perform at this time but I think we should continue to monitor it.  We did check your hemoglobin A1c today which is a marker for diabetes and it was 5.8 which was essentially stable from your previous checks.  You are still prediabetic but not in diabetes range at this time.  If any of your symptoms worsen please be reevaluated.  I hope you have a great afternoon! ? ? ?

## 2021-10-26 NOTE — Progress Notes (Signed)
? ? ?  SUBJECTIVE:  ? ?CHIEF COMPLAINT / HPI:  ? ?Right arm and hand pain ?Patient reports pain mainly in his right arm and hand but also on the left side some when he wakes up.  He feels like it is worsened by sleeping and laying down and eventually resolves throughout the day.  Does report numbness and tingling in his hand which sometimes goes away and symptoms does not.  Reports that this all started after starting a new job but he has otherwise had no changes in his life.  It has been going for approximately 2 weeks.  Describes the pain as a burning sensation.  Denies any weakness in his upper or lower extremities.  Denies any physical injury. ? ?OBJECTIVE:  ? ?BP 130/75   Pulse 78   Temp 98.3 ?F (36.8 ?C)   Ht 5\' 6"  (1.676 m)   Wt (!) 378 lb 9.6 oz (171.7 kg)   SpO2 98%   BMI 61.11 kg/m?   ?General: Pleasant 21 year old male in no acute distress ?Cardiac: Regular rate and rhythm ?Rest: No work of breathing ?MSK: 5/5 strength in upper extremities bilaterally, ambulates without difficulty, no spinous process tenderness in the cervical region or thoracic region ?Neuro: Cranial nerves intact, sensation intact in upper extremities bilaterally, ? ?ASSESSMENT/PLAN:  ? ?Neuropathy ?Patient signs and symptoms consistent with nerve compression in the upper extremity/shoulder.  Recommend avoiding things that exacerbate the numbness.  Discussed that this may be due to patient's weight gain and weight loss is important.  Discussed strict return precautions.  No further questions or concerns. ?  ? ? ?36, MD ?[facility]  ? ?

## 2021-10-27 DIAGNOSIS — G629 Polyneuropathy, unspecified: Secondary | ICD-10-CM | POA: Insufficient documentation

## 2021-10-27 NOTE — Assessment & Plan Note (Signed)
Patient signs and symptoms consistent with nerve compression in the upper extremity/shoulder.  Recommend avoiding things that exacerbate the numbness.  Discussed that this may be due to patient's weight gain and weight loss is important.  Discussed strict return precautions.  No further questions or concerns. ?

## 2021-10-28 ENCOUNTER — Telehealth: Payer: Self-pay

## 2021-10-28 ENCOUNTER — Other Ambulatory Visit (HOSPITAL_COMMUNITY): Payer: Self-pay

## 2021-10-28 NOTE — Telephone Encounter (Signed)
A Prior Authorization was initiated for this patients MODAFINIL through [employer].  ? ?CONFIRMATION #: [id number] W ? ?

## 2021-10-30 NOTE — Telephone Encounter (Signed)
Prior Auth for patients medication MODAFINIL approved by MEDICAID from 10/28/21 to 10/28/22. ? ?Patients pharmacy notified. ? ?

## 2021-11-27 ENCOUNTER — Ambulatory Visit (INDEPENDENT_AMBULATORY_CARE_PROVIDER_SITE_OTHER): Payer: Medicaid Other | Admitting: Pulmonary Disease

## 2021-11-27 ENCOUNTER — Encounter: Payer: Self-pay | Admitting: Pulmonary Disease

## 2021-11-27 VITALS — BP 138/72 | HR 82 | Temp 98.0°F | Ht 66.0 in | Wt 379.8 lb

## 2021-11-27 DIAGNOSIS — E669 Obesity, unspecified: Secondary | ICD-10-CM

## 2021-11-27 DIAGNOSIS — G4730 Sleep apnea, unspecified: Secondary | ICD-10-CM

## 2021-11-27 DIAGNOSIS — G4733 Obstructive sleep apnea (adult) (pediatric): Secondary | ICD-10-CM

## 2021-11-27 NOTE — Patient Instructions (Signed)
You can call [facility] at [phone] ? ?Follow up in 1 year ?

## 2021-11-27 NOTE — Progress Notes (Signed)
? ?[facility] ? ?Chief Complaint  ?Patient presents with  ? Follow-up  ?  "good days and bad days"  ? ? ?Constitutional:  ?BP 138/72 (BP Location: Left Arm, Patient Position: Sitting, Cuff Size: Large)   Pulse 82   Temp 98 ?F (36.7 ?C) (Oral)   Ht 5\' 6"  (1.676 m)   Wt (!) 379 lb 12.8 oz (172.3 kg)   SpO2 99%   BMI 61.30 kg/m?  ? ?Past Medical History:  ?Asthma, Allergies, OSA ? ?Past Surgical History:  ?He  has a past surgical history that includes Adenoidectomy; Tympanostomy tube placement (2004); and Tonsilectomy, adenoidectomy, bilateral myringotomy and tubes. ? ?Brief Summary:  ?Bob [name] is a 21 y.o. male with obstructive sleep apnea, asthma, and allergies. ?  ? ? ? ?Subjective:  ? ?He is here with his mother. ? ?Weight at last visit in January 2022 was 358 lbs. ? ?No issues with mask fit or pressure setting. ? ?He goes to bed at 11 pm and gets up at 630 am.  Naps in the afternoon.  Uses Bipap whenever he is asleep.  He ran out of provigil few months ago, and didn't notice too much difference without this. ? ? ?Physical Exam:  ? ?Appearance - well kempt  ? ?ENMT - no sinus tenderness, no oral exudate, no LAN, Mallampati 4 airway, no stridor ? ?Respiratory - equal breath sounds bilaterally, no wheezing or rales ? ?CV - s1s2 regular rate and rhythm, no murmurs ? ?Ext - no clubbing, no edema ? ?Skin - no rashes ? ?Psych - normal mood and affect ? ?  ?Sleep Tests:  ?PSG 03/27/13 >> AHI 6.3 ?Auto Bipap 06/02/19 to 07/01/19 >> used on 26 of 30 nights with average 3 hrs 51 min.  Average AHI 47.7 with median Bipap 15/11 and 95 th percentile Bipap 16/12 cm H2O.  Air leak ?Bipap 05/20/20 >> Bipap 30/25 cm H2O >> AHI 12.8.  Didn't need supplemental oxygen. ?Auto Bipap 10/28/21 to 11/26/21 >> used on 30 of 30 nights with average 7 hrs 4 min.  Average AHI 16 with median Bipap 22/18 and 95 th percentile Bipap 25/21 cm H2O ? ?Social History:  ?He  reports that he has never  smoked. He has never used smokeless tobacco. He reports that he does not drink alcohol and does not use drugs. ? ?Family History:  ?His family history includes Asthma in his brother, father, and sister; Birth defects in his brother; Cancer in his father; Depression in his father; Diabetes in his father and mother; Hyperlipidemia in his father and mother; Hypertension in his father. ?  ? ? ?Assessment/Plan:  ? ?Obstructive sleep apnea. ?- he is compliant with Bipap and reports benefit from therapy ?- he uses [employer] for his DME ?- continue auto Bipap with max IPAP 25, min EPAP 15, PS 4 cm H2O ?- discussed that if he continues to gain weight, then Bipap might not be sufficient and then he might need a tracheostomy ? ?Mild, intermittent asthma and Allergic rhinitis. ?- prn flonase., zyrtec ?  ?Obesity. ?- provided contact information for [facility] ? ?Time Spent Involved in Patient Care on Day of Examination:  ?26 minutes ? ?Follow up:  ? ?Patient Instructions  ?You can call [facility] at [phone] ? ?Follow up in 1 year ? ?Medication List:  ? ?Allergies as of 11/27/2021   ? ?   Reactions  ? [facility] [employer]   Other (See Comments)  ? Asphyxiation  ? ?  ? ?  ?Medication List  ?  ? ?  ? Accurate as of Nov 27, 2021  1:50 PM. If you have any questions, ask your nurse or doctor.  ?  ?  ? ?  ? ?STOP taking these medications   ? ?modafinil 100 MG tablet ?Commonly known as: Provigil ?Stopped by: Chesley Mires, MD ?  ? ?  ? ?TAKE these medications   ? ?cetirizine 10 MG tablet ?Commonly known as: ZYRTEC ?TAKE 1 TABLET BY MOUTH EVERY NIGHT AT BEDTIME AS NEEDED FOR ALLERGIES ?  ?cholecalciferol 25 MCG (1000 UNIT) tablet ?Commonly known as: VITAMIN D3 ?Take 2 tablets (50 mcg total) by mouth daily. ?  ?famotidine 20 MG tablet ?Commonly known as: Pepcid ?Please take once a day for 2 weeks, then every other day for a week, then stop ?  ?fluticasone 50 MCG/ACT nasal spray ?Commonly  known as: FLONASE ?Place 1 spray into both nostrils daily. ?  ?multivitamin with minerals Tabs tablet ?Take 1 tablet by mouth daily. ?  ? ?  ? ? ?Signature:  ?Chesley Mires, MD ?[place] ?Pager - [phone] ?11/27/2021, 1:50 PM ?  ? ? ? ? ? ? ? ? ?

## 2021-12-22 ENCOUNTER — Encounter: Payer: Self-pay | Admitting: *Deleted

## 2022-05-28 ENCOUNTER — Encounter: Payer: Self-pay | Admitting: Family Medicine

## 2022-05-28 ENCOUNTER — Ambulatory Visit (INDEPENDENT_AMBULATORY_CARE_PROVIDER_SITE_OTHER): Payer: Medicaid Other | Admitting: Family Medicine

## 2022-05-28 VITALS — BP 136/77 | HR 84 | Wt 384.6 lb

## 2022-05-28 DIAGNOSIS — R200 Anesthesia of skin: Secondary | ICD-10-CM | POA: Diagnosis not present

## 2022-05-28 DIAGNOSIS — J4530 Mild persistent asthma, uncomplicated: Secondary | ICD-10-CM

## 2022-05-28 DIAGNOSIS — G629 Polyneuropathy, unspecified: Secondary | ICD-10-CM

## 2022-05-28 DIAGNOSIS — Z6841 Body Mass Index (BMI) 40.0 and over, adult: Secondary | ICD-10-CM

## 2022-05-28 LAB — POCT GLYCOSYLATED HEMOGLOBIN (HGB A1C): Hemoglobin A1C: 5.7 % — AB (ref 4.0–5.6)

## 2022-05-28 MED ORDER — FLUTICASONE PROPIONATE 50 MCG/ACT NA SUSP: 1.0000 | Freq: Every day | NASAL | 10 refills | Status: DC

## 2022-05-28 NOTE — Patient Instructions (Addendum)
It was great seeing you today!  You came in for your hand and arm numbness and tingling and underwent refer you to sports medicine to check for carpal tunnel, but in the meantime use the wrist brace at night and do some stretches during the day.  The sports med team will call you to schedule an appointment.  We also irrigated your ears to help with the wax.  You can get Debrox drops over-the-counter if the wax builds up again we were not able to get everything out.  You use this a few times a day for about a week help loosen the wax  I prescribed Flonase to use 1 spray in each nostril daily to see if that helps with your breathing.  I highly recommend exercising and your nutrition which will help in every avenue of your health.  If you become interested in a dietitian referral let me know and I can place that for you, and I am also happy to have a visit with you to discuss them further.  Feel free to call with any questions or concerns at any time, at [phone].   Take care,  Dr. Cora Collum [facility]

## 2022-05-28 NOTE — Progress Notes (Signed)
    SUBJECTIVE:   CHIEF COMPLAINT / HPI:   Patient presents for continued arm and hand numbness. Was seen for this in April when symptoms started but does not improvement since then. Feels numbness in his shoulder when he lays on it and will wake up with the numbness. Notes improvement during the day. Denies numbness and thingling of his arm during the day. Sometimes holds things and gets numbness and tingling in his hands. Shakes his hand out for relief.   Exercise walks about 30 min 3 days a week  Nutrition- over eating and eats more meat and carbs. Does drink a lot of water   PERTINENT  PMH / PSH: Reviewed   OBJECTIVE:   BP 136/77   Pulse 84   Wt (!) 384 lb 9.6 oz (174.5 kg)   SpO2 98%   BMI 62.08 kg/m    General: alert, NAD CV: RRR no murmurs Resp: CTAB normal WOB GI: soft, non distended   R and L Hands: Inspection: No obvious deformity b/l. No swelling, erythema or bruising b/l Palpation: no TTP b/l ROM: Full ROM of the digits and wrist b/l. Fully able to extend and flex finger. Strength: 5/5 strength in the forearm, wrist and interosseus muscles b/l Neurovascular: NV intact b/l Special tests: negative tinel's at the carpal tunnel, discomfort with Phalen's and reverse Phalen's   ASSESSMENT/PLAN:   Neuropathy Patient endorses arm numbness after he lays on it as well as intermittent hand numbness both occurring since around March. 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 Less likely diabetes but will check A1c given elevated in the past. Physical exam unremarkable. Discussed using wrist braces at night and doing hand exercises during the day. Also discussed weight loss as his weight is likely contributing as well. Declined nutrition referral. Will refer to sports medicine clinic for ultrasound of wrists to check median nerve size and further evaluation.    Obesity BMI 62.08. Discussed weight loss with nutrition and exercise. Declines  nutrition referral. Really stressed importance of getting weight down for all aspects of his health.Would discuss bariatric surgery in the future. Will check A1c, lipid panel today.   Refilled Flonase for nasal congestion   Cora Collum, DO [facility]

## 2022-05-28 NOTE — Assessment & Plan Note (Signed)
Patient endorses arm numbness after he lays on it as well as intermittent hand numbness both occurring since around March. 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 Less likely diabetes but will check A1c given elevated in the past. Physical exam unremarkable. Discussed using wrist braces at night and doing hand exercises during the day. Also discussed weight loss as his weight is likely contributing as well. Declined nutrition referral. Will refer to sports medicine clinic for ultrasound of wrists to check median nerve size and further evaluation.

## 2022-05-29 LAB — LIPID PANEL
Chol/HDL Ratio: 4.2 ratio (ref 0.0–5.0)
Cholesterol, Total: 204 mg/dL — ABNORMAL HIGH (ref 100–199)
HDL: 49 mg/dL (ref >39)
LDL Chol Calc (NIH): 137 mg/dL — ABNORMAL HIGH (ref 0–99)
Triglycerides: 98 mg/dL (ref 0–149)
VLDL Cholesterol Cal: 18 mg/dL (ref 5–40)

## 2022-05-29 LAB — HCV INTERPRETATION

## 2022-05-29 LAB — HCV AB W REFLEX TO QUANT PCR: HCV Ab: NONREACTIVE

## 2022-06-08 ENCOUNTER — Telehealth: Payer: Self-pay | Admitting: Pulmonary Disease

## 2022-06-08 NOTE — Telephone Encounter (Signed)
Called and left voicemail for mother to call office back in regards to cpap and medication questions.

## 2022-12-01 ENCOUNTER — Ambulatory Visit: Payer: Medicaid Other | Admitting: Student

## 2022-12-06 ENCOUNTER — Other Ambulatory Visit (HOSPITAL_COMMUNITY): Payer: Self-pay

## 2022-12-06 ENCOUNTER — Ambulatory Visit (INDEPENDENT_AMBULATORY_CARE_PROVIDER_SITE_OTHER): Payer: Medicaid Other | Admitting: Pulmonary Disease

## 2022-12-06 ENCOUNTER — Ambulatory Visit (INDEPENDENT_AMBULATORY_CARE_PROVIDER_SITE_OTHER): Payer: Medicaid Other | Admitting: Student

## 2022-12-06 ENCOUNTER — Encounter (HOSPITAL_BASED_OUTPATIENT_CLINIC_OR_DEPARTMENT_OTHER): Payer: Self-pay | Admitting: Pulmonary Disease

## 2022-12-06 ENCOUNTER — Encounter: Payer: Self-pay | Admitting: Student

## 2022-12-06 VITALS — BP 116/86 | HR 83 | Temp 98.7°F | Ht 69.0 in | Wt 393.0 lb

## 2022-12-06 VITALS — BP 122/67 | HR 84 | Ht 69.0 in | Wt 392.0 lb

## 2022-12-06 DIAGNOSIS — E669 Obesity, unspecified: Secondary | ICD-10-CM

## 2022-12-06 DIAGNOSIS — H6123 Impacted cerumen, bilateral: Secondary | ICD-10-CM

## 2022-12-06 DIAGNOSIS — M25531 Pain in right wrist: Secondary | ICD-10-CM

## 2022-12-06 DIAGNOSIS — G4730 Sleep apnea, unspecified: Secondary | ICD-10-CM

## 2022-12-06 DIAGNOSIS — G4733 Obstructive sleep apnea (adult) (pediatric): Secondary | ICD-10-CM | POA: Diagnosis not present

## 2022-12-06 DIAGNOSIS — M25532 Pain in left wrist: Secondary | ICD-10-CM | POA: Diagnosis not present

## 2022-12-06 MED ORDER — DEBROX 6.5 % OT SOLN
5.0000 [drp] | Freq: Two times a day (BID) | OTIC | 0 refills | Status: DC
Start: 2022-12-06 — End: 2023-02-14
  Filled 2022-12-06: qty 15, 30d supply, fill #0

## 2022-12-06 MED ORDER — MELOXICAM 15 MG PO TABS
15.0000 mg | ORAL_TABLET | Freq: Every day | ORAL | 0 refills | Status: DC
Start: 2022-12-06 — End: 2022-12-20
  Filled 2022-12-06: qty 14, 14d supply, fill #0

## 2022-12-06 NOTE — Patient Instructions (Signed)
Will have [employer] change your Bipap settings and refit your mask  Follow up in 1 year

## 2022-12-06 NOTE — Progress Notes (Signed)
    SUBJECTIVE:   CHIEF COMPLAINT / HPI:   Patient is a 22 year old male presenting today for concerns of bilateral wrist pain.  Pain started couple of months ago with some intermittent numbness and tingling sensation on both wrist but worst on the left than the right.  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  Sometimes he will notice himself flicking his wrist to improve symptoms and he has tried brace with no significant relief.  He denies any trauma, fever or any swellings in the area.  PERTINENT  PMH / PSH: Reviewed   OBJECTIVE:   BP 122/67   Pulse 84   Ht 5\' 9"  (1.753 m)   Wt (!) 392 lb (177.8 kg)   SpO2 98%   BMI 57.89 kg/m    Physical Exam  General: Alert, well appearing, NAD Wrist Exam No erythema, or edema of the left wrist.  TTP on palmar surface of the radia aspect of the left wrist with palpation.  FROM with strength. Normal adduction and abduction of  interossesi muscles.  Negative finkelstein, tinels, phalens.  ASSESSMENT/PLAN:   Bilateral wrist pain Patient presenting with subacute wrist pain and first distal metacarpal joint with tenderness over the thenar area. Suspect carpal tunnel given positive flick however order considered diagnosis includes CMC joint arthritis vs DeQuervain  vein. -Encourage use of thumb spica brace and advise patient to be consistent with wearing brace -Rx daily meloxicam for 14 days -Advised use of lidocaine patch/Voltaren gel over the affected area -Discussed ultrasound guided steroid injection if no improvement or worsening symptoms.    Jerre Simon, MD [facility]

## 2022-12-06 NOTE — Patient Instructions (Signed)
It was wonderful to meet you today. Thank you for allowing me to be a part of your care. Below is a short summary of what we discussed at your visit today:  Suspect your wrist pain to be most likely carpel tunnel or inflammation around your wrist joint.  I have sent in prescription for meloxicam which you take once daily for 14 days.  Recommend continue use of your wrist thumb spica brace.  Make sure you are using this brace at sleep as well.  You can also apply over-the-counter Voltaren gel on the affected area.  Follow-up with your PCP in a month to reevaluate progress.  Please bring all of your medications to every appointment!  If you have any questions or concerns, please do not hesitate to contact us via phone or MyChart message.   Jerre Simon, MD [facility]

## 2022-12-06 NOTE — Progress Notes (Signed)
[place] [facility]  Chief Complaint  Patient presents with   Follow-up    Follow up.     Constitutional:  BP 116/86 (BP Location: Right Wrist, Patient Position: Sitting, Cuff Size: Normal)   Pulse 83   Temp 98.7 F (37.1 C) (Oral)   Ht 5\' 9"  (1.753 m)   Wt (!) 393 lb (178.3 kg)   SpO2 96%   BMI 58.04 kg/m   Past Medical History:  Asthma, Allergies, OSA  Past Surgical History:  He  has a past surgical history that includes Adenoidectomy; Tympanostomy tube placement (2004); and Tonsilectomy, adenoidectomy, bilateral myringotomy and tubes.  Brief Summary:  Bob [name] is a 22 y.o. male with obstructive sleep apnea, asthma, and allergies.      Subjective:   He is here with his mother.  His weight last visit in May 2023 was 379 lbs.  He uses a full face mask.  Feels like pressure is too high and he gets mask leak.  Sleeps okay otherwise.  Falls asleep when he gets bored during the day, but no issues when he stays active.  Has pain in his left wrist.  He plays video games a lot on his computer.   Physical Exam:   Appearance - well kempt   ENMT - no sinus tenderness, no oral exudate, no LAN, Mallampati 4 airway, no stridor  Respiratory - equal breath sounds bilaterally, no wheezing or rales  CV - s1s2 regular rate and rhythm, no murmurs  Ext - no clubbing, no edema  Skin - no rashes  Psych - normal mood and affect     Sleep Tests:  PSG 03/27/13 >> AHI 6.3 Auto Bipap 06/02/19 to 07/01/19 >> used on 26 of 30 nights with average 3 hrs 51 min.  Average AHI 47.7 with median Bipap 15/11 and 95 th percentile Bipap 16/12 cm H2O.  Air leak Bipap 05/20/20 >> Bipap 30/25 cm H2O >> AHI 12.8.  Didn't need supplemental oxygen. Auto Bipap 11/06/22 to 12/05/22 >> used on 30 of 30 nights with average 7 hrs 50 min.  Average AHI 18.2 with median Bipap 21/17 and 95 th percentile Bipap 24/20 cm H2O.  Air leak noted.  Social History:  He   reports that he has never smoked. He has never used smokeless tobacco. He reports that he does not drink alcohol and does not use drugs.  Family History:  His family history includes Asthma in his brother, father, and sister; Birth defects in his brother; Cancer in his father; Depression in his father; Diabetes in his father and mother; Hyperlipidemia in his father and mother; Hypertension in his father.     Assessment/Plan:   Obstructive sleep apnea. - he is compliant with Bipap and reports benefit from therapy - he uses [employer] for his DME - current Bipap ordered October 2020 - might be getting retropharyngeal collapse from higher pressures and full face mask - will change him to a nasal mask - will change his auto Bipap to max IPAP 25, min EPAP 9, PS 5 cm H2O - discussed that if he continues to gain weight, then Bipap might not be sufficient and then he might need a tracheostomy  Mild, intermittent asthma and Allergic rhinitis. - prn flonase, zyrtec   Obesity. - he will d/w his PCP at his next visit  Left wrist pain. - likely related to overuse - he can try using prn OTC NSAIDs - he has appointment scheduled with his PCP  to discuss further  Time Spent Involved in Patient Care on Day of Examination:  27 minutes  Follow up:   Patient Instructions  Will have [employer] change your Bipap settings and refit your mask  Follow up in 1 year  Medication List:   Allergies as of 12/06/2022       Reactions   [facility] Flavor Other (See Comments)   Asphyxiation        Medication List        Accurate as of Dec 06, 2022  1:36 PM. If you have any questions, ask your nurse or doctor.          cetirizine 10 MG tablet Commonly known as: ZYRTEC TAKE 1 TABLET BY MOUTH EVERY NIGHT AT BEDTIME AS NEEDED FOR ALLERGIES   cholecalciferol 25 MCG (1000 UNIT) tablet Commonly known as: VITAMIN D3 Take 2 tablets (50 mcg total) by mouth daily.   famotidine 20 MG tablet Commonly  known as: Pepcid Please take once a day for 2 weeks, then every other day for a week, then stop   fluticasone 50 MCG/ACT nasal spray Commonly known as: FLONASE Place 1 spray into both nostrils daily.   multivitamin with minerals Tabs tablet Take 1 tablet by mouth daily.        Signature:  Coralyn Helling, MD [facility] Pulmonary/Critical Care Pager - [phone] 12/06/2022, 1:36 PM

## 2022-12-20 ENCOUNTER — Other Ambulatory Visit (HOSPITAL_COMMUNITY): Payer: Self-pay

## 2022-12-20 ENCOUNTER — Other Ambulatory Visit: Payer: Self-pay

## 2022-12-20 ENCOUNTER — Encounter: Payer: Self-pay | Admitting: Family Medicine

## 2022-12-20 ENCOUNTER — Ambulatory Visit (INDEPENDENT_AMBULATORY_CARE_PROVIDER_SITE_OTHER): Payer: Medicaid Other | Admitting: Family Medicine

## 2022-12-20 VITALS — BP 103/65 | HR 72 | Ht 67.0 in | Wt 387.8 lb

## 2022-12-20 DIAGNOSIS — E559 Vitamin D deficiency, unspecified: Secondary | ICD-10-CM

## 2022-12-20 DIAGNOSIS — M25531 Pain in right wrist: Secondary | ICD-10-CM

## 2022-12-20 DIAGNOSIS — Z6841 Body Mass Index (BMI) 40.0 and over, adult: Secondary | ICD-10-CM | POA: Diagnosis not present

## 2022-12-20 DIAGNOSIS — R7303 Prediabetes: Secondary | ICD-10-CM

## 2022-12-20 DIAGNOSIS — M25532 Pain in left wrist: Secondary | ICD-10-CM

## 2022-12-20 DIAGNOSIS — M25539 Pain in unspecified wrist: Secondary | ICD-10-CM | POA: Insufficient documentation

## 2022-12-20 DIAGNOSIS — G5602 Carpal tunnel syndrome, left upper limb: Secondary | ICD-10-CM

## 2022-12-20 LAB — POCT GLYCOSYLATED HEMOGLOBIN (HGB A1C): Hemoglobin A1C: 5.6 % (ref 4.0–5.6)

## 2022-12-20 MED ORDER — MELOXICAM 15 MG PO TABS
15.0000 mg | ORAL_TABLET | Freq: Every day | ORAL | 0 refills | Status: AC
Start: 2022-12-20 — End: 2023-01-05
  Filled 2022-12-20: qty 14, 14d supply, fill #0

## 2022-12-20 NOTE — Patient Instructions (Addendum)
It was great seeing you today!  Today we discussed weight loss, and I am getting some blood work done based on that submit the prescription for the once weekly injection.  I will call you about your blood work if anything is abnormal, or send a MyChart message  Sure to eat well-balanced meals that include vegetables at every meal, lean meat, trying to avoid fried foods and processed foods.  Also try to eliminate juices and sugary beverages.  Continue to get an exercise daily.  I am also referring you to orthopedic surgery for your carpal tunnel in case you need an injection or other treatment options.  Please check-out at the front desk before leaving the clinic. I'd like to see you back in about 3 weeks, but if you need to be seen earlier than that for any new issues we're happy to fit you in, just give [place] a call!  Feel free to call with any questions or concerns at any time, at [phone].   Take care,  Dr. Cora Collum [facility]

## 2022-12-20 NOTE — Assessment & Plan Note (Addendum)
Ongoing wrist pain and numbness. On exam today had positive finkelstein's and tinel's at the carpal tunnel. Potentially carpal tunnel syndrome and De Quervain tenosynovitis.  Minimally improved with nightly wrist brace, daily Meloxicam. Will refer to Ortho for further evaluation and treatment. May benefit from injection.

## 2022-12-20 NOTE — Assessment & Plan Note (Addendum)
BMI 60.74. A1c 5.6. Discussed dietary changes such as reducing sugary beverages, fried and processed foods and incorporating vegetables and lean protein with every meal. Will check labs and prescribe Tirzepetide 2.5mg  weekly. Advised him to follow up in 3 weeks and if not approved for the injections will discuss other options like Phentermine, etc.  - lipid panel, random insulin, BMP

## 2022-12-20 NOTE — Progress Notes (Addendum)
    SUBJECTIVE:   CHIEF COMPLAINT / HPI:   Presents for weight loss. Feels his weight is affecting his breathing, worsening his sleep apnea, and contributing to his hyperpigmentation of his lower legs. Has been walking 5 days a week a mile and a half. Would like to start medication.   Sleep apnea: States that Dr. Craige Cotta who is his pulm doctor says bcause he isnt getting enough oxygen to his legs which is causing discolorations  Uses CPAP at night. Working on getting a new device.   Carpal tunnel: Has been taking Meloxicam helps some and he has been using his brace at night. Currently is wearing an ace bandage wrap   PERTINENT  PMH / PSH: Reviewed   OBJECTIVE:   BP 103/65   Pulse 72   Ht 5\' 7"  (1.702 m)   Wt (!) 387 lb 12.8 oz (175.9 kg)   SpO2 99%   BMI 60.74 kg/m    Physical exam General: well appearing, NAD Cardiovascular: RRR, no murmurs Lungs: CTAB. Normal WOB Abdomen: soft, non-distended, non-tender Skin: warm, dry MSK L Hand: Inspection: No obvious deformity. Has ace bandage in place Palpation: mild TTP over carpal tunnel and at base of 1st finge4r ROM: Full ROM of the digits and wrist b/l. Fully able to extend and flex finger. Strength: 5/5 strength in the forearm, wrist and interosseus muscles Neurovascular: NV intact Special tests: Positive finkelstein's and tinel's at the carpal tunnel, some discomfort with Phalen's   ASSESSMENT/PLAN:   Obesity BMI 60.74. A1c 5.6. Discussed dietary changes such as reducing sugary beverages, fried and processed foods and incorporating vegetables and lean protein with every meal. Will check labs and prescribe Tirzepetide 2.5mg  weekly. Advised him to follow up in 3 weeks and if not approved for the injections will discuss other options like Phentermine, etc.  - lipid panel, random insulin, BMP  Wrist pain Ongoing wrist pain and numbness. On exam today had positive finkelstein's and tinel's at the carpal tunnel. Potentially carpal  tunnel syndrome and De Quervain tenosynovitis.  Minimally improved with nightly wrist brace, daily Meloxicam. Will refer to Ortho for further evaluation and treatment. May benefit from injection.   Vitamin D deficiency Vit D level of 8 in the past. Requests to be rechecked.  - Vit D, 25-hydroxy   Cora Collum, DO [facility]

## 2022-12-21 ENCOUNTER — Other Ambulatory Visit: Payer: Self-pay | Admitting: Family Medicine

## 2022-12-21 DIAGNOSIS — E88819 Insulin resistance, unspecified: Secondary | ICD-10-CM

## 2022-12-21 DIAGNOSIS — G4733 Obstructive sleep apnea (adult) (pediatric): Secondary | ICD-10-CM

## 2022-12-21 LAB — LIPID PANEL
Chol/HDL Ratio: 4.2 ratio (ref 0.0–5.0)
Cholesterol, Total: 190 mg/dL (ref 100–199)
HDL: 45 mg/dL (ref >39)
LDL Chol Calc (NIH): 126 mg/dL — ABNORMAL HIGH (ref 0–99)
Triglycerides: 103 mg/dL (ref 0–149)
VLDL Cholesterol Cal: 19 mg/dL (ref 5–40)

## 2022-12-21 LAB — BASIC METABOLIC PANEL
BUN/Creatinine Ratio: 13 (ref 9–20)
BUN: 13 mg/dL (ref 6–20)
CO2: 22 mmol/L (ref 20–29)
Calcium: 9.9 mg/dL (ref 8.7–10.2)
Chloride: 107 mmol/L — ABNORMAL HIGH (ref 96–106)
Creatinine, Ser: 0.99 mg/dL (ref 0.76–1.27)
Glucose: 81 mg/dL (ref 70–99)
Potassium: 4.2 mmol/L (ref 3.5–5.2)
Sodium: 143 mmol/L (ref 134–144)
eGFR: 110 mL/min/{1.73_m2} (ref >59)

## 2022-12-21 LAB — VITAMIN D 25 HYDROXY (VIT D DEFICIENCY, FRACTURES): Vit D, 25-Hydroxy: 15.7 ng/mL — ABNORMAL LOW (ref 30.0–100.0)

## 2022-12-21 LAB — INSULIN, RANDOM: INSULIN: 34.3 u[IU]/mL — ABNORMAL HIGH (ref 2.6–24.9)

## 2022-12-21 MED ORDER — TIRZEPATIDE-WEIGHT MANAGEMENT 2.5 MG/0.5ML ~~LOC~~ SOAJ
2.5000 mg | SUBCUTANEOUS | 0 refills | Status: DC
Start: 2022-12-21 — End: 2023-01-10

## 2022-12-21 MED ORDER — VITAMIN D (ERGOCALCIFEROL) 1.25 MG (50000 UNIT) PO CAPS: 50000.0000 [IU] | ORAL_CAPSULE | ORAL | 0 refills | Status: DC

## 2022-12-22 ENCOUNTER — Other Ambulatory Visit (HOSPITAL_COMMUNITY): Payer: Self-pay

## 2023-01-05 ENCOUNTER — Ambulatory Visit (INDEPENDENT_AMBULATORY_CARE_PROVIDER_SITE_OTHER): Payer: Medicaid Other | Admitting: Orthopaedic Surgery

## 2023-01-05 DIAGNOSIS — M654 Radial styloid tenosynovitis [de Quervain]: Secondary | ICD-10-CM

## 2023-01-05 DIAGNOSIS — R202 Paresthesia of skin: Secondary | ICD-10-CM | POA: Diagnosis not present

## 2023-01-05 NOTE — Progress Notes (Signed)
Office Visit Note   Patient: Bob [name]           Date of Birth: 10-21-2000           MRN: [id number] Visit Date: 01/05/2023              Requested by: Carney Living, MD [address] [place],  [place] [address] PCP: Cora Collum, DO   Assessment & Plan: Visit Diagnoses:  1. Paresthesias in left hand   2. Paresthesias in right hand   3. Tenosynovitis, de Quervain     Plan: Impression is left wrist de Quervain's tenosynovitis and bilateral carpal tunnel syndrome.  In regards to the wrist, we discussed various treatment options to include thumb spica splint in addition to cortisone injection.  He would like to proceed with splint only at this point.  In regards to the carpal tunnel syndrome, we have discussed referral to Dr. Alvester Morin for bilateral upper extremity nerve conduction study/EMG.  He is agreeable to this plan.  He will follow-up with [place] once this is completed.  Call with concerns or questions in the meantime.  Follow-Up Instructions: Return for f/u after NCS/EMG.   Orders:  No orders of the defined types were placed in this encounter.  No orders of the defined types were placed in this encounter.     Procedures: No procedures performed   Clinical Data: No additional findings.   Subjective: Chief Complaint  Patient presents with   Left Wrist - Pain    HPI patient is a pleasant 22 year old right-hand-dominant gentleman who comes in today with bilateral hand paresthesias in addition to pain to the left hand.  Symptoms have been ongoing for the past 2 years or so but have worsened over the past month.  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  The pain he is complaining of is to the left first dorsal compartment.  Worse with ulnar deviation.  The paresthesias he has are to the right ring and long fingers as well as to the left index and  long fingers.  He has worn Velcro splints as well as compression wraps without relief.  He has tried NSAIDs without relief.  No previous nerve conduction study or cortisone injections to the wrist or carpal tunnel.  Review of Systems  Constitutional: Negative.   HENT: Negative.    Eyes: Negative.   Respiratory: Negative.    Cardiovascular: Negative.   Gastrointestinal: Negative.   Endocrine: Negative.   Genitourinary: Negative.   Skin: Negative.   Allergic/Immunologic: Negative.   Neurological: Negative.   Hematological: Negative.   Psychiatric/Behavioral: Negative.    All other systems reviewed and are negative.  as detailed in HPI.  All others reviewed and are negative.   Objective: Vital Signs: There were no vitals taken for this visit.  Physical Exam Vitals and nursing note reviewed.  Constitutional:      Appearance: He is well-developed.  HENT:     Head: Normocephalic and atraumatic.  Eyes:     Pupils: Pupils are equal, round, and reactive to light.  Pulmonary:     Effort: Pulmonary effort is normal.  Abdominal:     Palpations: Abdomen is soft.  Musculoskeletal:        General: Normal range of motion.     Cervical back: Neck supple.  Skin:    General: Skin is warm.  Neurological:  Mental Status: He is alert and oriented to person, place, and time.  Psychiatric:        Behavior: Behavior normal.        Thought Content: Thought content normal.        Judgment: Judgment normal.    well-developed well-nourished gentleman in no acute distress.  Alert and oriented x 3.  Ortho Exam left wrist exam shows mild tenderness to the first dorsal compartment.  He does have a positive Finkelstein.  Negative Phalen and negative Tinel both wrist.  He is neurovascularly intact distally.  Specialty Comments:  No specialty comments available.  Imaging: No new imaging   PMFS History: Patient Active Problem List   Diagnosis Date Noted   Wrist pain 12/20/2022    Neuropathy 10/27/2021   Low back pain 09/02/2021   Contusion of left great toe without damage to nail 09/02/2021   Hyperkeratosis 05/16/2020   Daytime somnolence 02/29/2020   Mild intermittent asthma without complication 07/06/2017   Hypersomnia 07/28/2016   Acanthosis nigricans 04/01/2016   Obstructive sleep apnea 08/27/2013   Seasonal allergies    Obesity 01/01/2013   Past Medical History:  Diagnosis Date   Acid reflux disease 2021   was on famotidine but stopped after improvement   Allergy    Asthma 2007   Asthma exacerbation 11/17/12   Development delay    Karyotype normal, fragile X normal   Early puberty, male 2009   at 7 years, familial   Impaired speech articulation 2011   Inadequate social skills 2011   Myopia 2010   Newborn exposure to maternal syphilis 2002   treated during preg   Obesity 2010   Seasonal allergies 2007   Sleep apnea     Family History  Problem Relation Age of Onset   Diabetes Mother    Hyperlipidemia Mother    Asthma Father    Cancer Father    Depression Father    Diabetes Father    Hyperlipidemia Father    Hypertension Father    Asthma Sister    Asthma Brother    Birth defects Brother     Past Surgical History:  Procedure Laterality Date   ADENOIDECTOMY     22 years old   TONSILECTOMY, ADENOIDECTOMY, BILATERAL MYRINGOTOMY AND TUBES     TYMPANOSTOMY TUBE PLACEMENT  2004   Social History   Occupational History   Not on file  Tobacco Use   Smoking status: Never   Smokeless tobacco: Never  Vaping Use   Vaping Use: Never used  Substance and Sexual Activity   Alcohol use: Never   Drug use: Never   Sexual activity: Never

## 2023-01-10 ENCOUNTER — Other Ambulatory Visit: Payer: Self-pay

## 2023-01-10 ENCOUNTER — Other Ambulatory Visit (HOSPITAL_COMMUNITY): Payer: Self-pay

## 2023-01-10 ENCOUNTER — Ambulatory Visit (INDEPENDENT_AMBULATORY_CARE_PROVIDER_SITE_OTHER): Payer: Medicaid Other | Admitting: Family Medicine

## 2023-01-10 ENCOUNTER — Encounter: Payer: Self-pay | Admitting: Family Medicine

## 2023-01-10 VITALS — BP 110/65 | HR 94 | Ht 67.0 in | Wt 384.2 lb

## 2023-01-10 DIAGNOSIS — G5603 Carpal tunnel syndrome, bilateral upper limbs: Secondary | ICD-10-CM

## 2023-01-10 DIAGNOSIS — Z6841 Body Mass Index (BMI) 40.0 and over, adult: Secondary | ICD-10-CM

## 2023-01-10 MED ORDER — DICLOFENAC SODIUM 1 % EX GEL: 4.0000 g | Freq: Four times a day (QID) | CUTANEOUS | 0 refills | Status: DC

## 2023-01-10 MED ORDER — ZEPBOUND 2.5 MG/0.5ML ~~LOC~~ SOAJ
2.5000 mg | SUBCUTANEOUS | 0 refills | Status: DC
  Filled 2023-01-10: qty 2, 28d supply, fill #0

## 2023-01-10 NOTE — Progress Notes (Unsigned)
    SUBJECTIVE:   CHIEF COMPLAINT / HPI:   Patient presents with his mom to discuss recent visit to Ortho  Was seen by hand doctor and was diagnosed with DeQuarvains tenosynovitis and bilateral carpal tunnel syndrome and various treatment options were discussed including thumb spica splint and cortisone injection but opted to just do the splint.  He is also being referred for a nerve conduction study will follow-up with Ortho once completed  He states he hasn't heard back about scheduling nerve study but did miss a call. Will call back Ortho office   Hasn't picked up the Zepbound yet. Mom called pharmacy and apparently it was sent to [facility]. Requests to be sent to his local pharmacy.   PERTINENT  PMH / PSH: Reviewed   OBJECTIVE:   BP 110/65   Pulse 94   Ht 5\' 7"  (1.702 m)   Wt (!) 384 lb 3.2 oz (174.3 kg)   SpO2 98%   BMI 60.17 kg/m    Physical exam General: well appearing, NAD Cardiovascular: RRR, no murmurs Lungs: CTAB. Normal WOB Abdomen: soft, non-distended, non-tender Skin: warm, dry. No edema Extremities: wrist brace on R UE  ASSESSMENT/PLAN:   Obesity Re-sent Zepbound to local pharmacy. Patient will follow up 2 weeks after he starts medication.   Carpal tunnel syndrome Following with Ortho. Conservative management with wrist braces and Voltaren gel (will refill). Will get steroid injection if pain worsens. In the process of scheduling his nerve conduction study.     Cora Collum, DO [facility]

## 2023-01-10 NOTE — Patient Instructions (Addendum)
It was great seeing you today!  Be sure to call about getting your nerve study done  Stop by the pharmacy to pick up your once weekly injections.   Return for follow up 2 weeks after you start the injections,  but if you need to be seen earlier than that for any new issues we're happy to fit you in, just give [place] a call!  Visit Reminders: - Stop by the pharmacy to pick up your prescriptions  - Continue to work on your healthy eating habits and incorporating exercise into your daily life.   Feel free to call with any questions or concerns at any time, at [phone].   Take care,  Dr. Cora Collum [facility]

## 2023-01-11 ENCOUNTER — Telehealth: Payer: Self-pay | Admitting: Physical Medicine and Rehabilitation

## 2023-01-11 DIAGNOSIS — G5600 Carpal tunnel syndrome, unspecified upper limb: Secondary | ICD-10-CM | POA: Insufficient documentation

## 2023-01-11 DIAGNOSIS — G5601 Carpal tunnel syndrome, right upper limb: Secondary | ICD-10-CM | POA: Insufficient documentation

## 2023-01-11 NOTE — Assessment & Plan Note (Signed)
Re-sent Zepbound to local pharmacy. Patient will follow up 2 weeks after he starts medication.

## 2023-01-11 NOTE — Telephone Encounter (Signed)
Pt called requesting a call back for nerve study referral. Please call pt at [phone].

## 2023-01-11 NOTE — Assessment & Plan Note (Signed)
>>  ASSESSMENT AND PLAN FOR CARPAL TUNNEL SYNDROME WRITTEN ON 01/11/2023  5:55 PM BY PAIGE, VICTORIA J, DO  Following with Ortho. Conservative management with wrist braces and Voltaren  gel (will refill). Will get steroid injection if pain worsens. In the process of scheduling his nerve conduction study.

## 2023-01-11 NOTE — Assessment & Plan Note (Addendum)
Following with Ortho. Conservative management with wrist braces and Voltaren gel (will refill). Will get steroid injection if pain worsens. In the process of scheduling his nerve conduction study.

## 2023-01-11 NOTE — Telephone Encounter (Signed)
Spoke with patient and scheduled NCV for 01/25/23.

## 2023-01-18 ENCOUNTER — Other Ambulatory Visit (HOSPITAL_COMMUNITY): Payer: Self-pay

## 2023-01-18 MED ORDER — DICLOFENAC SODIUM 1 % EX GEL
Freq: Four times a day (QID) | CUTANEOUS | 1 refills | Status: DC
  Filled 2023-01-18 – 2023-07-20 (×2): qty 100, 7d supply, fill #0

## 2023-01-25 ENCOUNTER — Ambulatory Visit (INDEPENDENT_AMBULATORY_CARE_PROVIDER_SITE_OTHER): Payer: MEDICAID | Admitting: Physical Medicine and Rehabilitation

## 2023-01-25 DIAGNOSIS — M25532 Pain in left wrist: Secondary | ICD-10-CM | POA: Diagnosis not present

## 2023-01-25 DIAGNOSIS — M79641 Pain in right hand: Secondary | ICD-10-CM

## 2023-01-25 DIAGNOSIS — G629 Polyneuropathy, unspecified: Secondary | ICD-10-CM

## 2023-01-25 DIAGNOSIS — M79642 Pain in left hand: Secondary | ICD-10-CM

## 2023-01-25 DIAGNOSIS — R202 Paresthesia of skin: Secondary | ICD-10-CM

## 2023-01-25 NOTE — Progress Notes (Signed)
Functional Pain Scale - descriptive words and definitions  Moderate (4)   Constantly aware of pain, can complete ADLs with modification/sleep marginally affected at times/passive distraction is of no use, but active distraction gives some relief. Moderate range order  Average Pain 7 at worse  Right handed. Bilateral hand numbness and tingling with some pain

## 2023-01-25 NOTE — Progress Notes (Signed)
Bob [name] - 22 y.o. male MRN [id number]  Date of birth: 2000/11/14  Office Visit Note: Visit Date: 01/25/2023 PCP: Alicia Amel, MD Referred by: Tarry Kos, MD  Subjective: Chief Complaint  Patient presents with   Right Hand - Pain, Numbness   Left Hand - Numbness, Pain   Left Wrist - Pain   HPI:  Bob [name] is a 22 y.o. male who comes in today at the request of Dr. Glee Arvin for evaluation and management of chronic, worsening and severe pain, numbness and tingling in the Bilateral upper extremities.  Patient is Right hand dominant.  He describes over 2 years of symptoms in both hands mainly with pain off and on and more recently progressive tingling and numbness right more than left.  Has had some history of neck pain but does not complain of pain shooting down the arms.  If anything he has worsening symptoms at the worst is 7 out of 10 pain radiating up the arms at times.  He does get worsening at night and with certain positions and activity.  He has tried compression wraps and some bracing without any real relief.  He has tried anti-inflammatories without relief.  Dr. Roda Shutters thought he had de Quervain's tenosynovitis and is wearing a brace on the left hand.  He has not had any injections diagnostically or therapeutically.  He has had no prior electrodiagnostic studies.  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  He is not diabetic.   I spent more than 30 minutes speaking face-to-face with the patient with 50% of the time in counseling and discussing coordination of care.    Review of Systems  Musculoskeletal:  Positive for joint pain.  Neurological:  Positive for tingling and weakness.  All other systems reviewed and are negative.  Otherwise per HPI.  Assessment & Plan: Visit Diagnoses:    ICD-10-CM   1. Paresthesia of skin  R20.2 NCV with EMG (electromyography)    2. Bilateral hand pain  M79.641     M79.642     3. Pain in left wrist  M25.532     4. Neuropathy  G62.9       Plan: Impression: Clinically he does have consistent signs and symptoms of a median neuropathy at the wrist probably right more than left but also clinical history of tenosynovitis and likely myofascial pain syndrome or underlying pain syndrome.  Electrodiagnostic study performed today.  The above electrodiagnostic study is ABNORMAL and reveals evidence of:  a severe right median nerve entrapment at the wrist (carpal tunnel syndrome) affecting sensory and motor components.   a moderate left median nerve entrapment at the wrist (carpal tunnel syndrome) affecting sensory and motor components.  There is no significant electrodiagnostic evidence of any other focal nerve entrapment, brachial plexopathy, cervical radiculopathy or generalized peripheral neuropathy.   Recommendations: 1.  Follow-up with referring physician. 2.  Continue current management of symptoms. 3.  Continue use of resting splint at night-time and as needed during the day.  May still have some tendinitis. 4.  Suggest surgical evaluation.  Meds & Orders: No orders of the defined types were placed in this encounter.   Orders Placed This Encounter  Procedures   NCV with EMG (electromyography)    Follow-up: Return for  Glee Arvin, MD.   Procedures: No procedures performed  EMG & NCV Findings: Evaluation of the left median motor and  the right median motor nerves showed prolonged distal onset latency (L5.0, R7.2 ms) and decreased conduction velocity (Elbow-Wrist, L46, R47 m/s).  The left median (across palm) sensory nerve showed prolonged distal peak latency (Wrist, 4.9 ms) and prolonged distal peak latency (Palm, 4.6 ms).  The right median (across palm) sensory nerve showed no response (Palm), prolonged distal peak latency (6.0 ms), and reduced amplitude (3.7 V).  All remaining nerves (as indicated in the following tables) were within normal limits.   Left vs. Right side comparison data for the median motor nerve indicates abnormal L-R latency difference (2.2 ms).  All remaining left vs. right side differences were within normal limits.    All examined muscles (as indicated in the following table) showed no evidence of electrical instability.    Impression: The above electrodiagnostic study is ABNORMAL and reveals evidence of:  a severe right median nerve entrapment at the wrist (carpal tunnel syndrome) affecting sensory and motor components.   a moderate left median nerve entrapment at the wrist (carpal tunnel syndrome) affecting sensory and motor components.  There is no significant electrodiagnostic evidence of any other focal nerve entrapment, brachial plexopathy, cervical radiculopathy or generalized peripheral neuropathy.   Recommendations: 1.  Follow-up with referring physician. 2.  Continue current management of symptoms. 3.  Continue use of resting splint at night-time and as needed during the day.  May still have some tendinitis. 4.  Suggest surgical evaluation.  ___________________________ Naaman Plummer FAAPMR Board Certified, American Board of Physical Medicine and Rehabilitation    Nerve Conduction Studies Anti Sensory Summary Table   Stim Site NR Peak (ms) Norm Peak (ms) P-T Amp (V) Norm P-T Amp Site1 Site2 Delta-P (ms) Dist (cm) Vel (m/s) Norm Vel (m/s)  Left Median Acr Palm Anti Sensory (2nd Digit)  32C  Wrist    *4.9 <3.6 18.3 >10 Wrist Palm 0.3 0.0    Palm    *4.6 <2.0 4.2         Right Median Acr Palm Anti Sensory (2nd Digit)  31.4C  Wrist    *6.0 <3.6 *3.7 >10 Wrist Palm  0.0    Palm *NR  <2.0          Left Radial Anti Sensory (Base 1st Digit)  31.6C  Wrist    1.9 <3.1 17.1  Wrist Base 1st Digit 1.9 0.0    Right Radial Anti Sensory (Base 1st Digit)  30.3C  Wrist    2.0 <3.1 19.6  Wrist Base 1st Digit 2.0 0.0    Left Ulnar Anti Sensory (5th Digit)  32.1C  Wrist    3.3 <3.7 18.1 >15.0 Wrist 5th Digit  3.3 14.0 42 >38  Right Ulnar Anti Sensory (5th Digit)  31.4C  Wrist    3.0 <3.7 17.8 >15.0 Wrist 5th Digit 3.0 14.0 47 >38   Motor Summary Table   Stim Site NR Onset (ms) Norm Onset (ms) O-P Amp (mV) Norm O-P Amp Site1 Site2 Delta-0 (ms) Dist (cm) Vel (m/s) Norm Vel (m/s)  Left Median Motor (Abd Poll Brev)  31.5C  Wrist    *5.0 <4.2 9.7 >5 Elbow Wrist 5.0 23.0 *46 >50  Elbow    10.0  8.0         Right Median Motor (Abd Poll Brev)  30.4C  Wrist    *7.2 <4.2 5.6 >5 Elbow Wrist 4.8 22.5 *47 >50  Elbow    12.0  2.4         Left Ulnar Motor (Abd Dig Min)  31.3C  Wrist    3.0 <4.2 11.0 >3 B Elbow Wrist 3.6 21.0 58 >53  B Elbow    6.6  9.3  A Elbow B Elbow 1.3 11.0 85 >53  A Elbow    7.9  9.0         Right Ulnar Motor (Abd Dig Min)  31.5C  Wrist    2.7 <4.2 10.9 >3 B Elbow Wrist 4.1 22.5 55 >53  B Elbow    6.8  10.2  A Elbow B Elbow 1.2 11.0 92 >53  A Elbow    8.0  10.3          EMG   Side Muscle Nerve Root Ins Act Fibs Psw Amp Dur Poly Recrt Int Dennie Bible Comment  Right Abd Poll Brev Median C8-T1 Nml Nml Nml Nml Nml 0 Nml Nml   Right 1stDorInt Ulnar C8-T1 Nml Nml Nml Nml Nml 0 Nml Nml   Right PronatorTeres Median C6-7 Nml Nml Nml Nml Nml 0 Nml Nml   Right Biceps Musculocut C5-6 Nml Nml Nml Nml Nml 0 Nml Nml   Right Deltoid Axillary C5-6 Nml Nml Nml Nml Nml 0 Nml Nml     Nerve Conduction Studies Anti Sensory Left/Right Comparison   Stim Site L Lat (ms) R Lat (ms) L-R Lat (ms) L Amp (V) R Amp (V) L-R Amp (%) Site1 Site2 L Vel (m/s) R Vel (m/s) L-R Vel (m/s)  Median Acr Palm Anti Sensory (2nd Digit)  32C  Wrist *4.9 *6.0 1.1 18.3 *3.7 79.8 Wrist Palm     Palm *4.6   4.2         Radial Anti Sensory (Base 1st Digit)  31.6C  Wrist 1.9 2.0 0.1 17.1 19.6 12.8 Wrist Base 1st Digit     Ulnar Anti Sensory (5th Digit)  32.1C  Wrist 3.3 3.0 0.3 18.1 17.8 1.7 Wrist 5th Digit 42 47 5   Motor Left/Right Comparison   Stim Site L Lat (ms) R Lat (ms) L-R Lat (ms) L Amp (mV) R Amp (mV) L-R  Amp (%) Site1 Site2 L Vel (m/s) R Vel (m/s) L-R Vel (m/s)  Median Motor (Abd Poll Brev)  31.5C  Wrist *5.0 *7.2 *2.2 9.7 5.6 42.3 Elbow Wrist *46 *47 1  Elbow 10.0 12.0 2.0 8.0 2.4 70.0       Ulnar Motor (Abd Dig Min)  31.3C  Wrist 3.0 2.7 0.3 11.0 10.9 0.9 B Elbow Wrist 58 55 3  B Elbow 6.6 6.8 0.2 9.3 10.2 8.8 A Elbow B Elbow 85 92 7  A Elbow 7.9 8.0 0.1 9.0 10.3 12.6          Waveforms:                      Clinical History: No specialty comments available.     Objective:  VS:  HT:    WT:   BMI:     BP:   HR: bpm  TEMP: ( )  RESP:  Physical Exam Vitals and nursing note reviewed.  Constitutional:      General: He is not in acute distress.    Appearance: Normal appearance. He is well-developed. He is obese.  HENT:     Head: Normocephalic and atraumatic.  Eyes:     Conjunctiva/sclera: Conjunctivae normal.     Pupils: Pupils are equal, round, and reactive to light.  Cardiovascular:     Rate and Rhythm: Normal rate.     Pulses: Normal pulses.  Heart sounds: Normal heart sounds.  Pulmonary:     Effort: Pulmonary effort is normal. No respiratory distress.  Musculoskeletal:        General: No tenderness.     Cervical back: Normal range of motion and neck supple. No rigidity.     Right lower leg: No edema.     Left lower leg: No edema.     Comments: Inspection reveals no atrophy of the bilateral APB or FDI or hand intrinsics. There is no swelling, color changes, allodynia or dystrophic changes. There is 5 out of 5 strength in the bilateral wrist extension, finger abduction and long finger flexion.  There is impaired sensation in the right median nerve distribution.. There is a positive Tinel's test at the bilateral wrists. There is a positive Phalen's test bilaterally. There is a negative Hoffmann's test bilaterally.  Skin:    General: Skin is warm and dry.     Findings: No erythema or rash.  Neurological:     General: No focal deficit present.     Mental  Status: He is alert and oriented to person, place, and time.     Cranial Nerves: No cranial nerve deficit.     Sensory: Sensory deficit present.     Motor: No weakness or abnormal muscle tone.     Coordination: Coordination normal.     Gait: Gait normal.  Psychiatric:        Mood and Affect: Mood normal.        Behavior: Behavior normal.        Thought Content: Thought content normal.      Imaging: No results found.

## 2023-01-31 ENCOUNTER — Other Ambulatory Visit (HOSPITAL_COMMUNITY): Payer: Self-pay

## 2023-01-31 ENCOUNTER — Encounter: Payer: Self-pay | Admitting: Physical Medicine and Rehabilitation

## 2023-01-31 NOTE — Procedures (Signed)
EMG & NCV Findings: Evaluation of the left median motor and the right median motor nerves showed prolonged distal onset latency (L5.0, R7.2 ms) and decreased conduction velocity (Elbow-Wrist, L46, R47 m/s).  The left median (across palm) sensory nerve showed prolonged distal peak latency (Wrist, 4.9 ms) and prolonged distal peak latency (Palm, 4.6 ms).  The right median (across palm) sensory nerve showed no response (Palm), prolonged distal peak latency (6.0 ms), and reduced amplitude (3.7 V).  All remaining nerves (as indicated in the following tables) were within normal limits.  Left vs. Right side comparison data for the median motor nerve indicates abnormal L-R latency difference (2.2 ms).  All remaining left vs. right side differences were within normal limits.    All examined muscles (as indicated in the following table) showed no evidence of electrical instability.    Impression: The above electrodiagnostic study is ABNORMAL and reveals evidence of:  a severe right median nerve entrapment at the wrist (carpal tunnel syndrome) affecting sensory and motor components.   a moderate left median nerve entrapment at the wrist (carpal tunnel syndrome) affecting sensory and motor components.  There is no significant electrodiagnostic evidence of any other focal nerve entrapment, brachial plexopathy, cervical radiculopathy or generalized peripheral neuropathy.   Recommendations: 1.  Follow-up with referring physician. 2.  Continue current management of symptoms. 3.  Continue use of resting splint at night-time and as needed during the day.  May still have some tendinitis. 4.  Suggest surgical evaluation.  ___________________________ Bob [name] FAAPMR Board Certified, American Board of Physical Medicine and Rehabilitation    Nerve Conduction Studies Anti Sensory Summary Table   Stim Site NR Peak (ms) Norm Peak (ms) P-T Amp (V) Norm P-T Amp Site1 Site2 Delta-P (ms) Dist (cm) Vel (m/s) Norm  Vel (m/s)  Left Median Acr Palm Anti Sensory (2nd Digit)  32C  Wrist    *4.9 <3.6 18.3 >10 Wrist Palm 0.3 0.0    Palm    *4.6 <2.0 4.2         Right Median Acr Palm Anti Sensory (2nd Digit)  31.4C  Wrist    *6.0 <3.6 *3.7 >10 Wrist Palm  0.0    Palm *NR  <2.0          Left Radial Anti Sensory (Base 1st Digit)  31.6C  Wrist    1.9 <3.1 17.1  Wrist Base 1st Digit 1.9 0.0    Right Radial Anti Sensory (Base 1st Digit)  30.3C  Wrist    2.0 <3.1 19.6  Wrist Base 1st Digit 2.0 0.0    Left Ulnar Anti Sensory (5th Digit)  32.1C  Wrist    3.3 <3.7 18.1 >15.0 Wrist 5th Digit 3.3 14.0 42 >38  Right Ulnar Anti Sensory (5th Digit)  31.4C  Wrist    3.0 <3.7 17.8 >15.0 Wrist 5th Digit 3.0 14.0 47 >38   Motor Summary Table   Stim Site NR Onset (ms) Norm Onset (ms) O-P Amp (mV) Norm O-P Amp Site1 Site2 Delta-0 (ms) Dist (cm) Vel (m/s) Norm Vel (m/s)  Left Median Motor (Abd Poll Brev)  31.5C  Wrist    *5.0 <4.2 9.7 >5 Elbow Wrist 5.0 23.0 *46 >50  Elbow    10.0  8.0         Right Median Motor (Abd Poll Brev)  30.4C  Wrist    *7.2 <4.2 5.6 >5 Elbow Wrist 4.8 22.5 *47 >50  Elbow    12.0  2.4  Left Ulnar Motor (Abd Dig Min)  31.3C  Wrist    3.0 <4.2 11.0 >3 B Elbow Wrist 3.6 21.0 58 >53  B Elbow    6.6  9.3  A Elbow B Elbow 1.3 11.0 85 >53  A Elbow    7.9  9.0         Right Ulnar Motor (Abd Dig Min)  31.5C  Wrist    2.7 <4.2 10.9 >3 B Elbow Wrist 4.1 22.5 55 >53  B Elbow    6.8  10.2  A Elbow B Elbow 1.2 11.0 92 >53  A Elbow    8.0  10.3          EMG   Side Muscle Nerve Root Ins Act Fibs Psw Amp Dur Poly Recrt Int Dennie Bible Comment  Right Abd Poll Brev Median C8-T1 Nml Nml Nml Nml Nml 0 Nml Nml   Right 1stDorInt Ulnar C8-T1 Nml Nml Nml Nml Nml 0 Nml Nml   Right PronatorTeres Median C6-7 Nml Nml Nml Nml Nml 0 Nml Nml   Right Biceps Musculocut C5-6 Nml Nml Nml Nml Nml 0 Nml Nml   Right Deltoid Axillary C5-6 Nml Nml Nml Nml Nml 0 Nml Nml     Nerve Conduction Studies Anti Sensory  Left/Right Comparison   Stim Site L Lat (ms) R Lat (ms) L-R Lat (ms) L Amp (V) R Amp (V) L-R Amp (%) Site1 Site2 L Vel (m/s) R Vel (m/s) L-R Vel (m/s)  Median Acr Palm Anti Sensory (2nd Digit)  32C  Wrist *4.9 *6.0 1.1 18.3 *3.7 79.8 Wrist Palm     Palm *4.6   4.2         Radial Anti Sensory (Base 1st Digit)  31.6C  Wrist 1.9 2.0 0.1 17.1 19.6 12.8 Wrist Base 1st Digit     Ulnar Anti Sensory (5th Digit)  32.1C  Wrist 3.3 3.0 0.3 18.1 17.8 1.7 Wrist 5th Digit 42 47 5   Motor Left/Right Comparison   Stim Site L Lat (ms) R Lat (ms) L-R Lat (ms) L Amp (mV) R Amp (mV) L-R Amp (%) Site1 Site2 L Vel (m/s) R Vel (m/s) L-R Vel (m/s)  Median Motor (Abd Poll Brev)  31.5C  Wrist *5.0 *7.2 *2.2 9.7 5.6 42.3 Elbow Wrist *46 *47 1  Elbow 10.0 12.0 2.0 8.0 2.4 70.0       Ulnar Motor (Abd Dig Min)  31.3C  Wrist 3.0 2.7 0.3 11.0 10.9 0.9 B Elbow Wrist 58 55 3  B Elbow 6.6 6.8 0.2 9.3 10.2 8.8 A Elbow B Elbow 85 92 7  A Elbow 7.9 8.0 0.1 9.0 10.3 12.6          Waveforms:

## 2023-02-03 ENCOUNTER — Encounter: Payer: Self-pay | Admitting: Orthopaedic Surgery

## 2023-02-03 ENCOUNTER — Ambulatory Visit: Payer: MEDICAID | Admitting: Orthopaedic Surgery

## 2023-02-03 DIAGNOSIS — G5602 Carpal tunnel syndrome, left upper limb: Secondary | ICD-10-CM | POA: Insufficient documentation

## 2023-02-03 DIAGNOSIS — G5601 Carpal tunnel syndrome, right upper limb: Secondary | ICD-10-CM

## 2023-02-03 NOTE — Progress Notes (Signed)
Office Visit Note   Patient: Bob [name]           Date of Birth: 02/25/2001           MRN: [id number] Visit Date: 02/03/2023              Requested by: Alicia Amel, MD [address] [place],  [place] [address] PCP: Alicia Amel, MD   Assessment & Plan: Visit Diagnoses:  1. Right carpal tunnel syndrome   2. Left carpal tunnel syndrome     Plan: Bob [name] is a 22 year old gentleman with severe right carpal tunnel syndrome and moderate left carpal tunnel syndrome.  Results were reviewed with the patient and his mother and recommendation has been made for right carpal tunnel release.  Details of surgery including risk benefits prognosis reviewed.  Questions encouraged and answered.  Bob [name] will call the patient to confirm surgery time.  Follow-Up Instructions: No follow-ups on file.   Orders:  No orders of the defined types were placed in this encounter.  No orders of the defined types were placed in this encounter.     Procedures: No procedures performed   Clinical Data: No additional findings.   Subjective: Chief Complaint  Patient presents with   Left Hand - Follow-up    EMG review   Right Hand - Follow-up    HPI Patient returns today for nerve conduction study review Review of Systems  Constitutional: Negative.   HENT: Negative.    Eyes: Negative.   Respiratory: Negative.    Cardiovascular: Negative.   Gastrointestinal: Negative.   Endocrine: Negative.   Genitourinary: Negative.   Skin: Negative.   Allergic/Immunologic: Negative.   Neurological: Negative.   Hematological: Negative.   Psychiatric/Behavioral: Negative.    All other systems reviewed and are negative.    Objective: Vital Signs: There were no vitals taken for this visit.  Physical Exam Vitals and nursing note reviewed.  Constitutional:      Appearance: He is well-developed.  HENT:     Head: Normocephalic and atraumatic.  Eyes:     Pupils: Pupils are equal, round, and  reactive to light.  Pulmonary:     Effort: Pulmonary effort is normal.  Abdominal:     Palpations: Abdomen is soft.  Musculoskeletal:        General: Normal range of motion.     Cervical back: Neck supple.  Skin:    General: Skin is warm.  Neurological:     Mental Status: He is alert and oriented to person, place, and time.  Psychiatric:        Behavior: Behavior normal.        Thought Content: Thought content normal.        Judgment: Judgment normal.     Ortho Exam Exam of bilateral hands is unchanged Specialty Comments:  No specialty comments available.  Imaging: No results found.   PMFS History: Patient Active Problem List   Diagnosis Date Noted   Right carpal tunnel syndrome 02/03/2023   Left carpal tunnel syndrome 02/03/2023   Carpal tunnel syndrome 01/11/2023   Wrist pain 12/20/2022   Neuropathy 10/27/2021   Low back pain 09/02/2021   Contusion of left great toe without damage to nail 09/02/2021   Hyperkeratosis 05/16/2020   Daytime somnolence 02/29/2020   Mild intermittent asthma without complication 07/06/2017   Hypersomnia 07/28/2016   Acanthosis nigricans 04/01/2016   Obstructive sleep apnea 08/27/2013   Seasonal allergies    Obesity 01/01/2013   Past  Medical History:  Diagnosis Date   Acid reflux disease 2021   was on famotidine but stopped after improvement   Allergy    Asthma 2007   Asthma exacerbation 11/17/12   Development delay    Karyotype normal, fragile X normal   Early puberty, male 2009   at 7 years, familial   Impaired speech articulation 2011   Inadequate social skills 2011   Myopia 2010   Newborn exposure to maternal syphilis 2002   treated during preg   Obesity 2010   Seasonal allergies 2007   Sleep apnea     Family History  Problem Relation Age of Onset   Diabetes Mother    Hyperlipidemia Mother    Asthma Father    Cancer Father    Depression Father    Diabetes Father    Hyperlipidemia Father    Hypertension Father     Asthma Sister    Asthma Brother    Birth defects Brother     Past Surgical History:  Procedure Laterality Date   ADENOIDECTOMY     22 years old   TONSILECTOMY, ADENOIDECTOMY, BILATERAL MYRINGOTOMY AND TUBES     TYMPANOSTOMY TUBE PLACEMENT  2004   Social History   Occupational History   Not on file  Tobacco Use   Smoking status: Never   Smokeless tobacco: Never  Vaping Use   Vaping status: Never Used  Substance and Sexual Activity   Alcohol use: Never   Drug use: Never   Sexual activity: Never

## 2023-02-09 ENCOUNTER — Other Ambulatory Visit (HOSPITAL_COMMUNITY): Payer: Self-pay

## 2023-02-09 ENCOUNTER — Encounter (HOSPITAL_COMMUNITY): Payer: Self-pay | Admitting: Orthopaedic Surgery

## 2023-02-09 ENCOUNTER — Other Ambulatory Visit: Payer: Self-pay | Admitting: Physician Assistant

## 2023-02-09 ENCOUNTER — Other Ambulatory Visit: Payer: Self-pay

## 2023-02-09 MED ORDER — HYDROCODONE-ACETAMINOPHEN 5-325 MG PO TABS
1.0000 | ORAL_TABLET | Freq: Three times a day (TID) | ORAL | 0 refills | Status: DC | PRN
Start: 2023-02-09 — End: 2023-03-28
  Filled 2023-02-09: qty 21, 7d supply, fill #0

## 2023-02-09 MED ORDER — ONDANSETRON HCL 4 MG PO TABS
4.0000 mg | ORAL_TABLET | Freq: Three times a day (TID) | ORAL | 0 refills | Status: DC | PRN
  Filled 2023-02-09: qty 20, 7d supply, fill #0

## 2023-02-09 NOTE — Progress Notes (Signed)
PCP - Alicia Amel Cardiologist - n/a  Chest x-ray - n/a EKG - n/a Stress Test - n/a ECHO - n/a Cardiac Cath - n/a  Sleep Study - yes (05-23-20) CPAP - uses nightly  ERAS Protcol - clear liquids unit 1200  COVID TEST- n/a  Anesthesia review: yes  Patient denies shortness of breath, fever, cough and chest pain via phone call.

## 2023-02-14 ENCOUNTER — Other Ambulatory Visit: Payer: Self-pay

## 2023-02-14 ENCOUNTER — Encounter (HOSPITAL_COMMUNITY)
Admission: RE | Disposition: A | Discharge status: Home / Self Care | Payer: Self-pay | Source: Home / Self Care | Attending: Orthopaedic Surgery

## 2023-02-14 ENCOUNTER — Ambulatory Visit (HOSPITAL_COMMUNITY)
Admission: RE | Admit: 2023-02-14 | Discharge: 2023-02-14 | Disposition: A | Discharge status: Home / Self Care | Payer: MEDICAID | Attending: Orthopaedic Surgery | Admitting: Orthopaedic Surgery

## 2023-02-14 ENCOUNTER — Ambulatory Visit (HOSPITAL_COMMUNITY): Payer: MEDICAID | Admitting: Physician Assistant

## 2023-02-14 ENCOUNTER — Encounter (HOSPITAL_COMMUNITY): Payer: Self-pay | Admitting: Orthopaedic Surgery

## 2023-02-14 DIAGNOSIS — G5601 Carpal tunnel syndrome, right upper limb: Secondary | ICD-10-CM | POA: Diagnosis present

## 2023-02-14 DIAGNOSIS — Z538 Procedure and treatment not carried out for other reasons: Secondary | ICD-10-CM | POA: Insufficient documentation

## 2023-02-14 HISTORY — DX: Prediabetes: R73.03

## 2023-02-14 LAB — BASIC METABOLIC PANEL
Anion gap: 9 (ref 5–15)
BUN: 14 mg/dL (ref 6–20)
CO2: 22 mmol/L (ref 22–32)
Calcium: 8.8 mg/dL — ABNORMAL LOW (ref 8.9–10.3)
Chloride: 108 mmol/L (ref 98–111)
Creatinine, Ser: 0.96 mg/dL (ref 0.61–1.24)
GFR, Estimated: >60 mL/min (ref >60)
Glucose, Bld: 99 mg/dL (ref 70–99)
Potassium: 3.7 mmol/L (ref 3.5–5.1)
Sodium: 139 mmol/L (ref 135–145)

## 2023-02-14 LAB — CBC
HCT: 37.2 % — ABNORMAL LOW (ref 39.0–52.0)
Hemoglobin: 12.6 g/dL — ABNORMAL LOW (ref 13.0–17.0)
MCH: 29.6 pg (ref 26.0–34.0)
MCHC: 33.9 g/dL (ref 30.0–36.0)
MCV: 87.3 fL (ref 80.0–100.0)
Platelets: 202 10*3/uL (ref 150–400)
RBC: 4.26 MIL/uL (ref 4.22–5.81)
RDW: 13.7 % (ref 11.5–15.5)
WBC: 6.6 10*3/uL (ref 4.0–10.5)
nRBC: 0 % (ref 0.0–0.2)

## 2023-02-14 SURGERY — CARPAL TUNNEL RELEASE
Anesthesia: Monitor Anesthesia Care | Site: Wrist | Laterality: Right

## 2023-02-14 MED ORDER — CHLORHEXIDINE GLUCONATE 0.12 % MT SOLN
OROMUCOSAL | Status: AC
  Administered 2023-02-14: 15 mL via OROMUCOSAL
  Filled 2023-02-14: qty 15

## 2023-02-14 MED ORDER — ORAL CARE MOUTH RINSE: 15.0000 mL | Freq: Once | OROMUCOSAL | Status: AC

## 2023-02-14 MED ORDER — CEFAZOLIN IN SODIUM CHLORIDE 3-0.9 GM/100ML-% IV SOLN
INTRAVENOUS | Status: AC
  Filled 2023-02-14: qty 100

## 2023-02-14 MED ORDER — LACTATED RINGERS IV SOLN: INTRAVENOUS | Status: DC

## 2023-02-14 MED ORDER — CEFAZOLIN IN SODIUM CHLORIDE 3-0.9 GM/100ML-% IV SOLN: 3.0000 g | INTRAVENOUS | Status: DC

## 2023-02-14 MED ORDER — CHLORHEXIDINE GLUCONATE 0.12 % MT SOLN: 15.0000 mL | Freq: Once | OROMUCOSAL | Status: AC

## 2023-02-14 NOTE — Progress Notes (Signed)
Patient surgery canceled. Patient ate full breakfast day of surgery. Anesthesia and surgeon made aware.

## 2023-02-14 NOTE — H&P (Signed)
PREOPERATIVE H&P  Chief Complaint: right carpal tunnel syndrome  HPI: Bob [name] is a 22 y.o. adult who presents for surgical treatment of right carpal tunnel syndrome.  She denies any changes in medical history.  Past Medical History:  Diagnosis Date   Acid reflux disease 2021   was on famotidine but stopped after improvement   Allergy    Asthma 2007   Asthma exacerbation 11/17/2012   no current problems no inhaler   Development delay    Karyotype normal, fragile X normal   Early puberty, male 2009   at 7 years, familial   Impaired speech articulation 2011   Inadequate social skills 2011   Myopia 2010   Newborn exposure to maternal syphilis 2002   treated during preg   Obesity 2010   Seasonal allergies 2007   Sleep apnea    uses CPAP nightly   Past Surgical History:  Procedure Laterality Date   ADENOIDECTOMY     22 years old   TONSILECTOMY, ADENOIDECTOMY, BILATERAL MYRINGOTOMY AND TUBES     TYMPANOSTOMY TUBE PLACEMENT  2004   Social History   Socioeconomic History   Marital status: Single    Spouse name: Not on file   Number of children: Not on file   Years of education: Not on file   Highest education level: Some college, no degree  Occupational History   Not on file  Tobacco Use   Smoking status: Never   Smokeless tobacco: Never  Vaping Use   Vaping status: Never Used  Substance and Sexual Activity   Alcohol use: Never   Drug use: Never   Sexual activity: Never  Other Topics Concern   Not on file  Social History Narrative   Lives with Mother and older brother [name]. [employer] helps out.    Social Determinants of Health   Financial Resource Strain: Not on file  Food Insecurity: Not on file  Transportation Needs: Not on file  Physical Activity: Not on file  Stress: Not on file  Social Connections: Not on file   Family History  Problem Relation Age of Onset   Diabetes Mother    Hyperlipidemia Mother    Asthma Father    Cancer Father     Depression Father    Diabetes Father    Hyperlipidemia Father    Hypertension Father    Asthma Sister    Asthma Brother    Birth defects Brother    Allergies  Allergen Reactions   [facility] Other (See Comments)    Asphyxiation   Prior to Admission medications   Medication Sig Start Date End Date Taking? Authorizing Provider  acetaminophen (TYLENOL) 325 MG tablet Take 975 mg by mouth every 6 (six) hours as needed for moderate pain.   Yes [provider]  cetirizine (ZYRTEC) 10 MG tablet Take 10 mg by mouth at bedtime as needed for allergies.   Yes [provider]  diclofenac Sodium (VOLTAREN) 1 % GEL Apply 4 grams topically 4 (four) times daily. Patient taking differently: Apply 1 Application topically 4 (four) times daily as needed (joint pain). 01/10/23  Yes   carbamide peroxide (DEBROX) 6.5 % OTIC solution Place 5 drops into both ears 2 (two) times daily. Patient not taking: Reported on 02/09/2023 12/06/22   Jerre Simon, MD  HYDROcodone-acetaminophen [facility]) 5-325 MG tablet Take 1 tablet by mouth 3 (three) times daily as needed for moderate pain after surgery 02/09/23   Cristie Hem, PA-C  ondansetron [facility])  4 MG tablet Take 1 tablet (4 mg total) by mouth every 8 (eight) hours as needed for nausea or vomiting. 02/09/23   Cristie Hem, PA-C  tirzepatide (ZEPBOUND) 2.5 MG/0.5ML Pen Inject 2.5 mg into the skin once a week. Patient not taking: Reported on 02/09/2023 01/10/23   Cora Collum, DO     Positive ROS: All other systems have been reviewed and were otherwise negative with the exception of those mentioned in the HPI and as above.  Physical Exam: General: Alert, no acute distress Cardiovascular: No pedal edema Respiratory: No cyanosis, no use of accessory musculature GI: abdomen soft Skin: No lesions in the area of chief complaint Neurologic: Sensation intact distally Psychiatric: Patient is competent for consent with normal mood and  affect Lymphatic: no lymphedema  MUSCULOSKELETAL: exam stable  Assessment: right carpal tunnel syndrome  Plan: Plan for Procedure(s): RIGHT CARPAL TUNNEL RELEASE  The risks benefits and alternatives were discussed with the patient including but not limited to the risks of nonoperative treatment, versus surgical intervention including infection, bleeding, nerve injury,  blood clots, cardiopulmonary complications, morbidity, mortality, among others, and they were willing to proceed.   Glee Arvin, MD 02/14/2023 12:34 PM

## 2023-02-14 NOTE — Discharge Instructions (Signed)
   Postoperative instructions:  Weightbearing instructions: don't lift more than 10 lbs for 4 weeks  Dressing instructions: Keep your dressing and/or splint clean and dry at all times.  It will be removed at your first post-operative appointment.  Your stitches and/or staples will be removed at this visit.  Incision instructions:  Do not soak your incision for 3 weeks after surgery.  If the incision gets wet, pat dry and do not scrub the incision.  Pain control:  You have been given a prescription to be taken as directed for post-operative pain control.  In addition, elevate the operative extremity above the heart at all times to prevent swelling and throbbing pain.  Take over-the-counter Colace, 100mg  by mouth twice a day while taking narcotic pain medications to help prevent constipation.  Follow up appointments: 1) 10 days for suture removal and wound check. 2) Dr. Roda Shutters as scheduled.   -------------------------------------------------------------------------------------------------------------  After Surgery Pain Control:  After your surgery, post-surgical discomfort or pain is likely. This discomfort can last several days to a few weeks. At certain times of the day your discomfort may be more intense.  Did you receive a nerve block?  A nerve block can provide pain relief for one hour to two days after your surgery. As long as the nerve block is working, you will experience little or no sensation in the area the surgeon operated on.  As the nerve block wears off, you will begin to experience pain or discomfort. It is very important that you begin taking your prescribed pain medication before the nerve block fully wears off. Treating your pain at the first sign of the block wearing off will ensure your pain is better controlled and more tolerable when full-sensation returns. Do not wait until the pain is intolerable, as the medicine will be less effective. It is better to treat pain in  advance than to try and catch up.  General Anesthesia:  If you did not receive a nerve block during your surgery, you will need to start taking your pain medication shortly after your surgery and should continue to do so as prescribed by your surgeon.  Pain Medication:  Most commonly we prescribe Vicodin and Percocet for post-operative pain. Both of these medications contain a combination of acetaminophen (Tylenol) and a narcotic to help control pain.   It takes between 30 and 45 minutes before pain medication starts to work. It is important to take your medication before your pain level gets too intense.   Nausea is a common side effect of many pain medications. You will want to eat something before taking your pain medicine to help prevent nausea.   If you are taking a prescription pain medication that contains acetaminophen, we recommend that you do not take additional over the counter acetaminophen (Tylenol).  Other pain relieving options:   Using a cold pack to ice the affected area a few times a day (15 to 20 minutes at a time) can help to relieve pain, reduce swelling and bruising.   Elevation of the affected area can also help to reduce pain and swelling.

## 2023-02-21 ENCOUNTER — Other Ambulatory Visit (HOSPITAL_COMMUNITY): Payer: Self-pay

## 2023-02-22 ENCOUNTER — Encounter: Payer: MEDICAID | Admitting: Physician Assistant

## 2023-03-09 ENCOUNTER — Ambulatory Visit (HOSPITAL_COMMUNITY): Admit: 2023-03-09 | Payer: MEDICAID | Admitting: Orthopaedic Surgery

## 2023-03-09 SURGERY — CARPAL TUNNEL RELEASE
Anesthesia: Monitor Anesthesia Care | Site: Wrist | Laterality: Right

## 2023-03-28 ENCOUNTER — Other Ambulatory Visit: Payer: Self-pay | Admitting: Physician Assistant

## 2023-03-28 ENCOUNTER — Other Ambulatory Visit (HOSPITAL_COMMUNITY): Payer: Self-pay

## 2023-03-28 MED ORDER — ONDANSETRON HCL 4 MG PO TABS
4.0000 mg | ORAL_TABLET | Freq: Three times a day (TID) | ORAL | 0 refills | Status: DC | PRN
  Filled 2023-03-28: qty 20, 7d supply, fill #0

## 2023-03-28 MED ORDER — HYDROCODONE-ACETAMINOPHEN 5-325 MG PO TABS
1.0000 | ORAL_TABLET | Freq: Three times a day (TID) | ORAL | 0 refills | Status: DC | PRN
Start: 2023-03-28 — End: 2023-10-19
  Filled 2023-03-28: qty 20, 7d supply, fill #0

## 2023-03-29 ENCOUNTER — Encounter (HOSPITAL_COMMUNITY): Payer: Self-pay | Admitting: Orthopaedic Surgery

## 2023-03-29 ENCOUNTER — Other Ambulatory Visit: Payer: Self-pay

## 2023-03-29 NOTE — Progress Notes (Signed)
SDW CALL  Patient's mother,Rhonda, was given pre-op instructions over the phone.Patient was at work and not available. The opportunity was given for the mother to ask questions. No further questions asked. Rhonda verbalized understanding of instructions given.   PCP - Franchot Erichsen DO Cardiologist - denies  PPM/ICD - denies Device Orders -  Rep Notified -   Chest x-ray - 06/22/19 EKG - 06/26/19 Stress Test - denies ECHO - denies Cardiac Cath -   Sleep Study - 05/23/20 CPAP - yes   Blood Thinner Instructions:na Aspirin Instructions:na  ERAS Protcol - clears until 1045 PRE-SURGERY Ensure or G2-   COVID TEST- na   Anesthesia review: yes    Surgical Instructions    Your procedure is scheduled on September 11  Report to [facility] Main Entrance "A" at 1115 A.M., then check in with the Admitting office.  Call this number if you have problems the morning of surgery:  [phone]    Remember:  Do not eat after midnight the night before your surgery  You may drink clear liquids until 1045 the morning of your surgery.   Clear liquids allowed are: Water, Non-Citrus Juices (without pulp), Carbonated Beverages, Clear Tea, Black Coffee ONLY (NO MILK, CREAM OR POWDERED CREAMER of any kind), and Gatorade   Take these medicines the morning of surgery with A SIP OF WATER: KGM:WNUUVO, Norco,Zofran    As of today, STOP taking any Aspirin (unless otherwise instructed by your surgeon) Aleve, Naproxen, Ibuprofen, Motrin, Advil, Goody's, BC's, all herbal medications, fish oil, and all vitamins. Hold Votaren Gel.  [facility] is not responsible for any belongings or valuables. .   Do NOT Smoke (Tobacco/Vaping)  24 hours prior to your procedure  If you use a CPAP at night, you may bring your mask for your overnight stay.   Contacts, glasses, hearing aids, dentures or partials may not be worn into surgery, please bring cases for these belongings   Patients discharged the day of  surgery will not be allowed to drive home, and someone needs to stay with them for 24 hours.     Special instructions:    Oral Hygiene is also important to reduce your risk of infection.  Remember - BRUSH YOUR TEETH THE MORNING OF SURGERY WITH YOUR REGULAR TOOTHPASTE   Day of Surgery:  Take a shower the day of or night before with antibacterial soap. Wear Clean/Comfortable clothing the morning of surgery Do not apply any deodorants/lotions.   Do not wear jewelry or makeup Do not wear lotions, powders, perfumes/colognes, or deodorant. Do not shave 48 hours prior to surgery.  Men may shave face and neck. Do not bring valuables to the hospital. Do not wear nail polish, gel polish, artificial nails, or any other type of covering on natural nails (fingers and toes) If you have artificial nails or gel coating that need to be removed by a nail salon, please have this removed prior to surgery. Artificial nails or gel coating may interfere with anesthesia's ability to adequately monitor your vital signs. Remember to brush your teeth WITH YOUR REGULAR TOOTHPASTE.

## 2023-03-30 ENCOUNTER — Other Ambulatory Visit: Payer: Self-pay

## 2023-03-30 ENCOUNTER — Other Ambulatory Visit (HOSPITAL_COMMUNITY): Payer: Self-pay

## 2023-03-30 ENCOUNTER — Ambulatory Visit (HOSPITAL_COMMUNITY): Payer: MEDICAID | Admitting: Physician Assistant

## 2023-03-30 ENCOUNTER — Encounter (HOSPITAL_COMMUNITY)
Admission: RE | Disposition: A | Discharge status: Home / Self Care | Payer: Self-pay | Source: Home / Self Care | Attending: Orthopaedic Surgery

## 2023-03-30 ENCOUNTER — Ambulatory Visit (HOSPITAL_COMMUNITY)
Admission: RE | Admit: 2023-03-30 | Discharge: 2023-03-30 | Disposition: A | Discharge status: Home / Self Care | Payer: MEDICAID | Attending: Orthopaedic Surgery | Admitting: Orthopaedic Surgery

## 2023-03-30 ENCOUNTER — Ambulatory Visit (HOSPITAL_BASED_OUTPATIENT_CLINIC_OR_DEPARTMENT_OTHER): Payer: MEDICAID | Admitting: Physician Assistant

## 2023-03-30 ENCOUNTER — Encounter (HOSPITAL_COMMUNITY): Payer: Self-pay | Admitting: Orthopaedic Surgery

## 2023-03-30 DIAGNOSIS — G5601 Carpal tunnel syndrome, right upper limb: Secondary | ICD-10-CM

## 2023-03-30 DIAGNOSIS — Z6841 Body Mass Index (BMI) 40.0 and over, adult: Secondary | ICD-10-CM | POA: Diagnosis not present

## 2023-03-30 DIAGNOSIS — J45909 Unspecified asthma, uncomplicated: Secondary | ICD-10-CM | POA: Diagnosis not present

## 2023-03-30 DIAGNOSIS — G4730 Sleep apnea, unspecified: Secondary | ICD-10-CM | POA: Insufficient documentation

## 2023-03-30 DIAGNOSIS — G4733 Obstructive sleep apnea (adult) (pediatric): Secondary | ICD-10-CM

## 2023-03-30 HISTORY — PX: CARPAL TUNNEL RELEASE: SHX101

## 2023-03-30 LAB — CBC
HCT: 40.5 % (ref 39.0–52.0)
Hemoglobin: 13 g/dL (ref 13.0–17.0)
MCH: 27.9 pg (ref 26.0–34.0)
MCHC: 32.1 g/dL (ref 30.0–36.0)
MCV: 86.9 fL (ref 80.0–100.0)
Platelets: 230 10*3/uL (ref 150–400)
RBC: 4.66 MIL/uL (ref 4.22–5.81)
RDW: 13.4 % (ref 11.5–15.5)
WBC: 5.4 10*3/uL (ref 4.0–10.5)
nRBC: 0 % (ref 0.0–0.2)

## 2023-03-30 SURGERY — CARPAL TUNNEL RELEASE
Anesthesia: Monitor Anesthesia Care | Site: Wrist | Laterality: Right

## 2023-03-30 MED ORDER — ONDANSETRON HCL 4 MG/2ML IJ SOLN
INTRAMUSCULAR | Status: DC | PRN
  Administered 2023-03-30: 4 mg via INTRAVENOUS

## 2023-03-30 MED ORDER — PROPOFOL 10 MG/ML IV BOLUS
INTRAVENOUS | Status: DC | PRN
  Administered 2023-03-30: 100 mg via INTRAVENOUS
  Administered 2023-03-30: 150 ug/kg/min via INTRAVENOUS
  Administered 2023-03-30: 100 mg via INTRAVENOUS

## 2023-03-30 MED ORDER — FENTANYL CITRATE (PF) 250 MCG/5ML IJ SOLN
INTRAMUSCULAR | Status: AC
  Filled 2023-03-30: qty 5

## 2023-03-30 MED ORDER — FENTANYL CITRATE (PF) 250 MCG/5ML IJ SOLN
INTRAMUSCULAR | Status: DC | PRN
  Administered 2023-03-30 (×3): 50 ug via INTRAVENOUS

## 2023-03-30 MED ORDER — LACTATED RINGERS IV SOLN: INTRAVENOUS | Status: DC

## 2023-03-30 MED ORDER — PHENYLEPHRINE 80 MCG/ML (10ML) SYRINGE FOR IV PUSH (FOR BLOOD PRESSURE SUPPORT)
PREFILLED_SYRINGE | INTRAVENOUS | Status: DC | PRN
  Administered 2023-03-30: 80 ug via INTRAVENOUS

## 2023-03-30 MED ORDER — ONDANSETRON HCL 4 MG/2ML IJ SOLN
INTRAMUSCULAR | Status: AC
  Filled 2023-03-30: qty 2

## 2023-03-30 MED ORDER — 0.9 % SODIUM CHLORIDE (POUR BTL) OPTIME
TOPICAL | Status: DC | PRN
Start: 2023-03-30 — End: 2023-03-30
  Administered 2023-03-30: 1000 mL

## 2023-03-30 MED ORDER — OXYCODONE HCL 5 MG/5ML PO SOLN: 5.0000 mg | Freq: Once | ORAL | Status: DC | PRN

## 2023-03-30 MED ORDER — DEXTROSE 5 % IV SOLN
INTRAVENOUS | Status: DC | PRN
  Administered 2023-03-30: 3 g via INTRAVENOUS

## 2023-03-30 MED ORDER — OXYCODONE HCL 5 MG PO TABS: 5.0000 mg | ORAL_TABLET | Freq: Once | ORAL | Status: DC | PRN

## 2023-03-30 MED ORDER — ORAL CARE MOUTH RINSE: 15.0000 mL | Freq: Once | OROMUCOSAL | Status: AC

## 2023-03-30 MED ORDER — DEXAMETHASONE SODIUM PHOSPHATE 10 MG/ML IJ SOLN
INTRAMUSCULAR | Status: DC | PRN
  Administered 2023-03-30 (×2): 10 mg via INTRAVENOUS

## 2023-03-30 MED ORDER — PROMETHAZINE HCL 25 MG/ML IJ SOLN: 6.2500 mg | INTRAMUSCULAR | Status: DC | PRN

## 2023-03-30 MED ORDER — DEXMEDETOMIDINE HCL IN NACL 200 MCG/50ML IV SOLN
INTRAVENOUS | Status: DC | PRN
  Administered 2023-03-30 (×2): 10 ug via INTRAVENOUS

## 2023-03-30 MED ORDER — PROPOFOL 10 MG/ML IV BOLUS
INTRAVENOUS | Status: AC
  Filled 2023-03-30: qty 20

## 2023-03-30 MED ORDER — BUPIVACAINE HCL (PF) 0.25 % IJ SOLN
INTRAMUSCULAR | Status: AC
  Filled 2023-03-30: qty 30

## 2023-03-30 MED ORDER — CHLORHEXIDINE GLUCONATE 0.12 % MT SOLN
15.0000 mL | Freq: Once | OROMUCOSAL | Status: AC
  Administered 2023-03-30: 15 mL via OROMUCOSAL
  Filled 2023-03-30: qty 15

## 2023-03-30 MED ORDER — LIDOCAINE 2% (20 MG/ML) 5 ML SYRINGE
INTRAMUSCULAR | Status: AC
  Filled 2023-03-30: qty 5

## 2023-03-30 MED ORDER — CEFAZOLIN IN SODIUM CHLORIDE 3-0.9 GM/100ML-% IV SOLN
3.0000 g | INTRAVENOUS | Status: DC
  Filled 2023-03-30: qty 100

## 2023-03-30 MED ORDER — BUPIVACAINE HCL (PF) 0.25 % IJ SOLN
INTRAMUSCULAR | Status: DC | PRN
Start: 2023-03-30 — End: 2023-03-30
  Administered 2023-03-30: 10 mL

## 2023-03-30 MED ORDER — DEXAMETHASONE SODIUM PHOSPHATE 10 MG/ML IJ SOLN
INTRAMUSCULAR | Status: AC
  Filled 2023-03-30: qty 1

## 2023-03-30 MED ORDER — MIDAZOLAM HCL 2 MG/2ML IJ SOLN
INTRAMUSCULAR | Status: DC | PRN
  Administered 2023-03-30: 2 mg via INTRAVENOUS

## 2023-03-30 MED ORDER — HYDROMORPHONE HCL 1 MG/ML IJ SOLN: 0.2500 mg | INTRAMUSCULAR | Status: DC | PRN

## 2023-03-30 MED ORDER — LIDOCAINE 2% (20 MG/ML) 5 ML SYRINGE
INTRAMUSCULAR | Status: DC | PRN
  Administered 2023-03-30: 100 mg via INTRAVENOUS

## 2023-03-30 MED ORDER — MIDAZOLAM HCL 2 MG/2ML IJ SOLN
INTRAMUSCULAR | Status: AC
  Filled 2023-03-30: qty 2

## 2023-03-30 SURGICAL SUPPLY — 44 items
BAG COUNTER SPONGE SURGICOUNT (BAG) ×2 IMPLANT
BAG SPNG CNTER NS LX DISP (BAG) ×1
BNDG CMPR 5X3 KNIT ELC UNQ LF (GAUZE/BANDAGES/DRESSINGS) ×1
BNDG CMPR 9X4 STRL LF SNTH (GAUZE/BANDAGES/DRESSINGS) ×1
BNDG ELASTIC 3INX 5YD STR LF (GAUZE/BANDAGES/DRESSINGS) IMPLANT
BNDG ESMARK 4X9 LF (GAUZE/BANDAGES/DRESSINGS) ×2 IMPLANT
BNDG GZE 12X3 1 PLY HI ABS (GAUZE/BANDAGES/DRESSINGS)
BNDG STRETCH GAUZE 3IN X12FT (GAUZE/BANDAGES/DRESSINGS) IMPLANT
CORD BIPOLAR FORCEPS 12FT (ELECTRODE) IMPLANT
COVER SURGICAL LIGHT HANDLE (MISCELLANEOUS) ×2 IMPLANT
CUFF TOURN SGL QUICK 18X4 (TOURNIQUET CUFF) IMPLANT
DRAPE SURG 17X23 STRL (DRAPES) IMPLANT
DRSG XEROFORM 1X8 (GAUZE/BANDAGES/DRESSINGS) IMPLANT
DURAPREP 26ML APPLICATOR (WOUND CARE) ×2 IMPLANT
ELECT REM PT RETURN 9FT ADLT (ELECTROSURGICAL)
ELECTRODE REM PT RTRN 9FT ADLT (ELECTROSURGICAL) IMPLANT
GAUZE SPONGE 4X4 12PLY STRL (GAUZE/BANDAGES/DRESSINGS) ×2 IMPLANT
GAUZE XEROFORM 1X8 LF (GAUZE/BANDAGES/DRESSINGS) ×2 IMPLANT
GLOVE BIOGEL PI IND STRL 7.0 (GLOVE) ×6 IMPLANT
GLOVE BIOGEL PI IND STRL 7.5 (GLOVE) ×2 IMPLANT
GLOVE ECLIPSE 7.0 STRL STRAW (GLOVE) ×4 IMPLANT
GLOVE SKINSENSE STRL SZ7.5 (GLOVE) ×4 IMPLANT
GLOVE SURG SYN 7.5 E (GLOVE) ×2 IMPLANT
GLOVE SURG SYN 7.5 PF PI (GLOVE) ×4 IMPLANT
GLOVE SURG UNDER POLY LF SZ7 (GLOVE) ×44 IMPLANT
GLOVE SURG UNDER POLY LF SZ7.5 (GLOVE) ×8 IMPLANT
GOWN STRL REUS W/ TWL LRG LVL3 (GOWN DISPOSABLE) ×2 IMPLANT
GOWN STRL REUS W/TWL LRG LVL3 (GOWN DISPOSABLE) ×1
GOWN STRL SURGICAL XL XLNG (GOWN DISPOSABLE) ×4 IMPLANT
KIT BASIN OR (CUSTOM PROCEDURE TRAY) ×2 IMPLANT
KIT TURNOVER KIT B (KITS) ×2 IMPLANT
NS IRRIG 1000ML POUR BTL (IV SOLUTION) ×4 IMPLANT
PACK ORTHO EXTREMITY (CUSTOM PROCEDURE TRAY) ×2 IMPLANT
PAD ARMBOARD 7.5X6 YLW CONV (MISCELLANEOUS) ×2 IMPLANT
PAD CAST 3X4 CTTN HI CHSV (CAST SUPPLIES) IMPLANT
PADDING CAST ABS COTTON 4X4 ST (CAST SUPPLIES) ×4 IMPLANT
PADDING CAST COTTON 3X4 STRL (CAST SUPPLIES) ×1
SUT ETHILON 3 0 PS 1 (SUTURE) IMPLANT
SUT ETHILON 4 0 PS 2 18 (SUTURE) ×2 IMPLANT
SYR CONTROL 10ML LL (SYRINGE) IMPLANT
TOWEL GREEN STERILE (TOWEL DISPOSABLE) ×2 IMPLANT
TOWEL GREEN STERILE FF (TOWEL DISPOSABLE) ×2 IMPLANT
UNDERPAD 30X36 HEAVY ABSORB (UNDERPADS AND DIAPERS) ×4 IMPLANT
WATER STERILE IRR 1000ML POUR (IV SOLUTION) ×2 IMPLANT

## 2023-03-30 NOTE — Anesthesia Preprocedure Evaluation (Signed)
Anesthesia Evaluation  Patient identified by MRN, date of birth, ID band Patient awake    Reviewed: Allergy & Precautions, H&P , NPO status , Patient's Chart, lab work & pertinent test results  Airway Mallampati: II  TM Distance: >3 FB Neck ROM: Full    Dental no notable dental hx.    Pulmonary asthma , sleep apnea    Pulmonary exam normal breath sounds clear to auscultation       Cardiovascular negative cardio ROS Normal cardiovascular exam Rhythm:Regular Rate:Normal     Neuro/Psych negative neurological ROS  negative psych ROS   GI/Hepatic Neg liver ROS,GERD  ,,  Endo/Other    Morbid obesity  Renal/GU negative Renal ROS  negative genitourinary   Musculoskeletal negative musculoskeletal ROS (+)    Abdominal  (+) + obese  Peds negative pediatric ROS (+)  Hematology negative hematology ROS (+)   Anesthesia Other Findings   Reproductive/Obstetrics negative OB ROS                             Anesthesia Physical Anesthesia Plan  ASA: 3  Anesthesia Plan: MAC   Post-op Pain Management: Minimal or no pain anticipated   Induction: Intravenous  PONV Risk Score and Plan: 1 and Treatment may vary due to age or medical condition and Propofol infusion  Airway Management Planned: Simple Face Mask  Additional Equipment:   Intra-op Plan:   Post-operative Plan:   Informed Consent: I have reviewed the patients History and Physical, chart, labs and discussed the procedure including the risks, benefits and alternatives for the proposed anesthesia with the patient or authorized representative who has indicated his/her understanding and acceptance.     Dental advisory given  Plan Discussed with: CRNA  Anesthesia Plan Comments:        Anesthesia Quick Evaluation

## 2023-03-30 NOTE — Op Note (Signed)
   Carpal tunnel op note  DATE OF SURGERY:03/30/2023  PREOPERATIVE DIAGNOSIS:  Right carpal tunnel syndrome  POSTOPERATIVE DIAGNOSIS: same  PROCEDURE: Right carpal tunnel release. CPT 60454  SURGEON: Cheral Almas, M.D.  ASSIST: Oneal Grout, [place]  ANESTHESIA:  Local and general LMA  TOURNIQUET TIME: less than 20 minutes  BLOOD LOSS: Minimal.  COMPLICATIONS: None.  PATHOLOGY: None.  INDICATIONS: The patient is a 22 y.o. -year-old adult who presented with carpal tunnel syndrome failing nonsurgical management, indicated for surgical release.  DESCRIPTION OF PROCEDURE: The patient was identified in the preoperative holding area.  The operative site was marked by the surgeon and confirmed by the patient.  The patient was brought back to the operating room.  MAC anesthesia was administered.  Local anesthetic with epi was injected into the operative site.  A well padded nonsterile tourniquet was placed. The operative extremity was prepped and draped in standard sterile fashion.  A timeout was performed.  Preoperative antibiotics were given.   A palmar incision was made about 5 mm ulnar to the thenar crease.  The palmar aponeurosis was exposed and divided in line with the skin incision. The palmaris brevis was visualized and divided.  The distal edge of the transcarpal ligament was identified. A hemostat was inserted into the carpal tunnel to protect the median nerve and the flexor tendons. Then, the transverse carpal ligament was released under direct visualization. Proximally, a subcutaneous tunnel was made allowing a Sewell retractor to be placed. Then, the distal portion of the antebrachial fascia was released. Distally, all fibrous bands were released. The median nerve was visualized, and the fat pad was exposed. Wound was irrigated and closed with 4-0 nylon sutures. Sterile dressing applied. The patient was transferred to the recovery room in stable condition after all counts  were correct.  POSTOPERATIVE PLAN: To start nerve gliding exercises as tolerated and no heavy lifting for four weeks.  Glee Arvin, M.D. [facility] 2:38 PM

## 2023-03-30 NOTE — H&P (Signed)
PREOPERATIVE H&P  Chief Complaint: right carpal tunnel syndrome  HPI: Bob [name] is a 22 y.o. adult who presents for surgical treatment of right carpal tunnel syndrome.  She denies any changes in medical history.  Past Surgical History:  Procedure Laterality Date   ADENOIDECTOMY     22 years old   TONSILECTOMY, ADENOIDECTOMY, BILATERAL MYRINGOTOMY AND TUBES     TYMPANOSTOMY TUBE PLACEMENT  2004   Social History   Socioeconomic History   Marital status: Single    Spouse name: Not on file   Number of children: Not on file   Years of education: Not on file   Highest education level: Some college, no degree  Occupational History   Not on file  Tobacco Use   Smoking status: Never   Smokeless tobacco: Never  Vaping Use   Vaping status: Never Used  Substance and Sexual Activity   Alcohol use: Never   Drug use: Never   Sexual activity: Never  Other Topics Concern   Not on file  Social History Narrative   Lives with Mother and older brother Bob [name]. [employer]GM helps out.    Social Determinants of Health   Financial Resource Strain: Not on file  Food Insecurity: Not on file  Transportation Needs: Not on file  Physical Activity: Not on file  Stress: Not on file  Social Connections: Not on file   Family History  Problem Relation Age of Onset   Diabetes Mother    Hyperlipidemia Mother    Asthma Father    Cancer Father    Depression Father    Diabetes Father    Hyperlipidemia Father    Hypertension Father    Asthma Sister    Asthma Brother    Birth defects Brother    Allergies  Allergen Reactions   Black Herbalist Other (See Comments)    Asphyxiation   Prior to Admission medications   Medication Sig Start Date End Date Taking? Authorizing Provider  cetirizine (ZYRTEC) 10 MG tablet Take 10 mg by mouth at bedtime as needed for allergies.   Yes [provider]  diclofenac Sodium (VOLTAREN) 1 % GEL Apply 4 grams topically 4 (four) times daily. Patient  taking differently: Apply 1 Application topically 4 (four) times daily as needed (joint pain). 01/10/23  Yes   HYDROcodone-acetaminophen (NORCO) 5-325 MG tablet Take 1 tablet by mouth 3 (three) times daily as needed for moderate pain after surgery 03/28/23   Cristie Hem, PA-C  ondansetron (ZOFRAN) 4 MG tablet Take 1 tablet (4 mg total) by mouth every 8 (eight) hours as needed for nausea or vomiting. 03/28/23   Cristie Hem, PA-C     Positive ROS: All other systems have been reviewed and were otherwise negative with the exception of those mentioned in the HPI and as above.  Physical Exam: General: Alert, no acute distress Cardiovascular: No pedal edema Respiratory: No cyanosis, no use of accessory musculature GI: abdomen soft Skin: No lesions in the area of chief complaint Neurologic: Sensation intact distally Psychiatric: Patient is competent for consent with normal mood and affect Lymphatic: no lymphedema  MUSCULOSKELETAL: exam stable  Assessment: right carpal tunnel syndrome  Plan: Plan for Procedure(s): RIGHT CARPAL TUNNEL RELEASE  The risks benefits and alternatives were discussed with the patient including but not limited to the risks of nonoperative treatment, versus surgical intervention including infection, bleeding, nerve injury,  blood clots, cardiopulmonary complications, morbidity, mortality, among others, and they were willing to proceed.  Glee Arvin, MD 03/30/2023 11:25 AM

## 2023-03-30 NOTE — Anesthesia Procedure Notes (Signed)
Procedure Name: LMA Insertion Date/Time: 03/30/2023 2:21 PM  Performed by: Pincus Large, CRNAPre-anesthesia Checklist: Patient identified, Emergency Drugs available, Suction available and Patient being monitored Patient Re-evaluated:Patient Re-evaluated prior to induction Oxygen Delivery Method: Circle System Utilized Preoxygenation: Pre-oxygenation with 100% oxygen Induction Type: IV induction Ventilation: Mask ventilation without difficulty LMA: LMA inserted LMA Size: 4.0 and 5.0 Number of attempts: 1 Airway Equipment and Method: Bite block Placement Confirmation: positive ETCO2 Tube secured with: Tape Dental Injury: Teeth and Oropharynx as per pre-operative assessment

## 2023-03-30 NOTE — Transfer of Care (Signed)
Immediate Anesthesia Transfer of Care Note  Patient: Bob [name]  Procedure(s) Performed: RIGHT CARPAL TUNNEL RELEASE (Right: Wrist)  Patient Location: PACU  Anesthesia Type:General  Level of Consciousness: sedated and drowsy  Airway & Oxygen Therapy: Patient Spontanous Breathing and Patient connected to face mask oxygen  Post-op Assessment: Report given to RN and Post -op Vital signs reviewed and stable  Post vital signs: Reviewed and stable  Last Vitals:  Vitals Value Taken Time  BP 125/53 03/30/23 1448  Temp    Pulse 90 03/30/23 1451  Resp 24 03/30/23 1451  SpO2 94 % 03/30/23 1451  Vitals shown include unfiled device data.  Last Pain:  Vitals:   03/30/23 1150  PainSc: 0-No pain         Complications: No notable events documented.

## 2023-03-30 NOTE — Discharge Instructions (Signed)
   Postoperative instructions:  Weightbearing instructions: don't lift more than 10 lbs for 4 weeks  Dressing instructions: Keep your dressing and/or splint clean and dry at all times.  It will be removed at your first post-operative appointment.  Your stitches and/or staples will be removed at this visit.  Incision instructions:  Do not soak your incision for 3 weeks after surgery.  If the incision gets wet, pat dry and do not scrub the incision.  Pain control:  You have been given a prescription to be taken as directed for post-operative pain control.  In addition, elevate the operative extremity above the heart at all times to prevent swelling and throbbing pain.  Take over-the-counter Colace, 100mg  by mouth twice a day while taking narcotic pain medications to help prevent constipation.  Follow up appointments: 1) 10 days for suture removal and wound check. 2) Dr. Roda Shutters as scheduled.   -------------------------------------------------------------------------------------------------------------  After Surgery Pain Control:  After your surgery, post-surgical discomfort or pain is likely. This discomfort can last several days to a few weeks. At certain times of the day your discomfort may be more intense.  Did you receive a nerve block?  A nerve block can provide pain relief for one hour to two days after your surgery. As long as the nerve block is working, you will experience little or no sensation in the area the surgeon operated on.  As the nerve block wears off, you will begin to experience pain or discomfort. It is very important that you begin taking your prescribed pain medication before the nerve block fully wears off. Treating your pain at the first sign of the block wearing off will ensure your pain is better controlled and more tolerable when full-sensation returns. Do not wait until the pain is intolerable, as the medicine will be less effective. It is better to treat pain in  advance than to try and catch up.  General Anesthesia:  If you did not receive a nerve block during your surgery, you will need to start taking your pain medication shortly after your surgery and should continue to do so as prescribed by your surgeon.  Pain Medication:  Most commonly we prescribe Vicodin and Percocet for post-operative pain. Both of these medications contain a combination of acetaminophen (Tylenol) and a narcotic to help control pain.   It takes between 30 and 45 minutes before pain medication starts to work. It is important to take your medication before your pain level gets too intense.   Nausea is a common side effect of many pain medications. You will want to eat something before taking your pain medicine to help prevent nausea.   If you are taking a prescription pain medication that contains acetaminophen, we recommend that you do not take additional over the counter acetaminophen (Tylenol).  Other pain relieving options:   Using a cold pack to ice the affected area a few times a day (15 to 20 minutes at a time) can help to relieve pain, reduce swelling and bruising.   Elevation of the affected area can also help to reduce pain and swelling.

## 2023-03-31 ENCOUNTER — Telehealth: Payer: Self-pay | Admitting: Orthopaedic Surgery

## 2023-03-31 ENCOUNTER — Encounter (HOSPITAL_COMMUNITY): Payer: Self-pay | Admitting: Orthopaedic Surgery

## 2023-03-31 NOTE — Anesthesia Postprocedure Evaluation (Signed)
Anesthesia Post Note  Patient: Bob [name]  Procedure(s) Performed: RIGHT CARPAL TUNNEL RELEASE (Right: Wrist)     Patient location during evaluation: PACU Anesthesia Type: MAC Level of consciousness: awake and alert Pain management: pain level controlled Vital Signs Assessment: post-procedure vital signs reviewed and stable Respiratory status: spontaneous breathing, nonlabored ventilation and respiratory function stable Cardiovascular status: blood pressure returned to baseline and stable Postop Assessment: no apparent nausea or vomiting Anesthetic complications: no   No notable events documented.  Last Vitals:  Vitals:   03/30/23 1515 03/30/23 1520  BP: 119/83   Pulse: 78 81  Resp: 20 17  Temp:  36.4 C  SpO2: 98% 94%    Last Pain:  Vitals:   03/30/23 1515  PainSc: 0-No pain   Pain Goal:                   Lowella Curb

## 2023-03-31 NOTE — Telephone Encounter (Signed)
Yes that's normal.  That's from the local we put in there.  It can last a day or two sometimes.

## 2023-03-31 NOTE — Telephone Encounter (Signed)
Pt called triage and LM on VM stating he had CTR surgery yesterday and he is having 4th and 5th finger numbness. Wants advise if normal or what he can do.

## 2023-04-01 NOTE — Telephone Encounter (Signed)
Notified patient.

## 2023-04-06 ENCOUNTER — Ambulatory Visit (INDEPENDENT_AMBULATORY_CARE_PROVIDER_SITE_OTHER): Payer: MEDICAID | Admitting: Physician Assistant

## 2023-04-06 DIAGNOSIS — G5601 Carpal tunnel syndrome, right upper limb: Secondary | ICD-10-CM

## 2023-04-06 DIAGNOSIS — Z9889 Other specified postprocedural states: Secondary | ICD-10-CM

## 2023-04-06 NOTE — Progress Notes (Signed)
Post-Op Visit Note   Patient: Bob [name]           Date of Birth: [birth year]           MRN: [id number] Visit Date: 04/06/2023 PCP: Pcp, No   Assessment & Plan:  Chief Complaint:  Chief Complaint  Patient presents with   Right Hand - Routine Post Op   Visit Diagnoses:  1. Right carpal tunnel syndrome   2. S/P carpal tunnel release     Plan: Patient is a pleasant 22 year old who comes in today 1 week status post right carpal tunnel release 03/30/2023.  He has been doing well.  No pain.  He still notes numbness to the tips of the ring and small fingers.  Examination of the right wrist reveals a well healing surgical incision with nylon sutures in place.  No evidence of infection or cellulitis.  Fingers warm well-perfused.  Today, his wound was cleaned and recovered.  Removable Velcro splint applied for which she will wear at all times for the next week.  He will follow-up next week for probable suture removal.  No heavy lifting or submerging his hand underwater for another 3 weeks.  Call with concerns or questions.  Follow-Up Instructions: Return in about 1 week (around 04/13/2023).   Orders:  No orders of the defined types were placed in this encounter.  No orders of the defined types were placed in this encounter.   Imaging: No new imaging  PMFS History: Patient Active Problem List   Diagnosis Date Noted   Right carpal tunnel syndrome 02/03/2023   Left carpal tunnel syndrome 02/03/2023   Carpal tunnel syndrome 01/11/2023   Wrist pain 12/20/2022   Neuropathy 10/27/2021   Low back pain 09/02/2021   Contusion of left great toe without damage to nail 09/02/2021   Hyperkeratosis 05/16/2020   Daytime somnolence 02/29/2020   Mild intermittent asthma without complication 07/06/2017   Hypersomnia 07/28/2016   Acanthosis nigricans 04/01/2016   Obstructive sleep apnea 08/27/2013   Seasonal allergies    Obesity 01/01/2013   Past Medical History:  Diagnosis Date   Acid  reflux disease 2021   was on famotidine but stopped after improvement   Allergy    Asthma 2007   Asthma exacerbation 11/17/2012   no current problems no inhaler   Development delay    Karyotype normal, fragile X normal   Early puberty, male 2009   at 7 years, familial   Impaired speech articulation 2011   Inadequate social skills 2011   Myopia 2010   Newborn exposure to maternal syphilis 2002   treated during preg   Obesity 2010   Pre-diabetes    Seasonal allergies 2007   Sleep apnea    uses CPAP nightly    Family History  Problem Relation Age of Onset   Diabetes Mother    Hyperlipidemia Mother    Asthma Father    Cancer Father    Depression Father    Diabetes Father    Hyperlipidemia Father    Hypertension Father    Asthma Sister    Asthma Brother    Birth defects Brother     Past Surgical History:  Procedure Laterality Date   ADENOIDECTOMY     22 years old   CARPAL TUNNEL RELEASE Right 03/30/2023   Procedure: RIGHT CARPAL TUNNEL RELEASE;  Surgeon: Tarry Kos, MD;  Location: [facility] OR;  Service: Orthopedics;  Laterality: Right;   TONSILECTOMY, ADENOIDECTOMY, BILATERAL MYRINGOTOMY AND TUBES  TYMPANOSTOMY TUBE PLACEMENT  2004   Social History   Occupational History   Not on file  Tobacco Use   Smoking status: Never   Smokeless tobacco: Never  Vaping Use   Vaping status: Never Used  Substance and Sexual Activity   Alcohol use: Never   Drug use: Never   Sexual activity: Never

## 2023-04-13 ENCOUNTER — Encounter: Payer: Self-pay | Admitting: Orthopaedic Surgery

## 2023-04-13 ENCOUNTER — Ambulatory Visit (INDEPENDENT_AMBULATORY_CARE_PROVIDER_SITE_OTHER): Payer: MEDICAID | Admitting: Physician Assistant

## 2023-04-13 DIAGNOSIS — G5601 Carpal tunnel syndrome, right upper limb: Secondary | ICD-10-CM

## 2023-04-13 NOTE — Progress Notes (Signed)
Post-Op Visit Note   Patient: Bob [name]           Date of Birth: 01-31-2001           MRN: [id number] Visit Date: 04/13/2023 PCP: Pcp, No   Assessment & Plan:  Chief Complaint:  Chief Complaint  Patient presents with   Right Wrist - Follow-up    Right carpal tunnel release 03/30/2023   Visit Diagnoses:  1. Right carpal tunnel syndrome     Plan: Patient is a pleasant 22 year old who comes in today 2 weeks status post right carpal tunnel release 03/30/2023.  He has been doing well.  He notes some paresthesias to the ring finger but otherwise no complaints.  Examination of his right hand reveals a fully healed surgical incision with nylon sutures in place.  No evidence of infection or cellulitis.  Things are warm well-perfused.  He does have decreased sensation to the tip of the ring finger.  Today, sutures were removed and Steri-Strips applied.  No heavy lifting or submerging his hand in water for the next 2 weeks.  Continue with nerve gliding exercises.  Follow-up in 2 weeks for recheck in anticipation to release back to work.  Call with concerns or questions in the meantime.  Follow-Up Instructions: Return in about 2 weeks (around 04/27/2023).   Orders:  No orders of the defined types were placed in this encounter.  No orders of the defined types were placed in this encounter.   Imaging: No new imaging  PMFS History: Patient Active Problem List   Diagnosis Date Noted   Right carpal tunnel syndrome 02/03/2023   Left carpal tunnel syndrome 02/03/2023   Carpal tunnel syndrome 01/11/2023   Wrist pain 12/20/2022   Neuropathy 10/27/2021   Low back pain 09/02/2021   Contusion of left great toe without damage to nail 09/02/2021   Hyperkeratosis 05/16/2020   Daytime somnolence 02/29/2020   Mild intermittent asthma without complication 07/06/2017   Hypersomnia 07/28/2016   Acanthosis nigricans 04/01/2016   Obstructive sleep apnea 08/27/2013   Seasonal allergies    Obesity  01/01/2013   Past Medical History:  Diagnosis Date   Acid reflux disease 2021   was on famotidine but stopped after improvement   Allergy    Asthma 2007   Asthma exacerbation 11/17/2012   no current problems no inhaler   Development delay    Karyotype normal, fragile X normal   Early puberty, male 2009   at 7 years, familial   Impaired speech articulation 2011   Inadequate social skills 2011   Myopia 2010   Newborn exposure to maternal syphilis 2002   treated during preg   Obesity 2010   Pre-diabetes    Seasonal allergies 2007   Sleep apnea    uses CPAP nightly    Family History  Problem Relation Age of Onset   Diabetes Mother    Hyperlipidemia Mother    Asthma Father    Cancer Father    Depression Father    Diabetes Father    Hyperlipidemia Father    Hypertension Father    Asthma Sister    Asthma Brother    Birth defects Brother     Past Surgical History:  Procedure Laterality Date   ADENOIDECTOMY     22 years old   CARPAL TUNNEL RELEASE Right 03/30/2023   Procedure: RIGHT CARPAL TUNNEL RELEASE;  Surgeon: Tarry Kos, MD;  Location: [facility] OR;  Service: Orthopedics;  Laterality: Right;   TONSILECTOMY, ADENOIDECTOMY,  BILATERAL MYRINGOTOMY AND TUBES     TYMPANOSTOMY TUBE PLACEMENT  2004   Social History   Occupational History   Not on file  Tobacco Use   Smoking status: Never   Smokeless tobacco: Never  Vaping Use   Vaping status: Never Used  Substance and Sexual Activity   Alcohol use: Never   Drug use: Never   Sexual activity: Never

## 2023-04-27 ENCOUNTER — Ambulatory Visit (INDEPENDENT_AMBULATORY_CARE_PROVIDER_SITE_OTHER): Payer: MEDICAID | Admitting: Physician Assistant

## 2023-04-27 ENCOUNTER — Encounter: Payer: Self-pay | Admitting: Orthopaedic Surgery

## 2023-04-27 DIAGNOSIS — G5601 Carpal tunnel syndrome, right upper limb: Secondary | ICD-10-CM

## 2023-04-27 NOTE — Progress Notes (Signed)
Post-Op Visit Note   Patient: Bob [name]           Date of Birth: [birth year]           MRN: [id number] Visit Date: 04/27/2023 PCP: Pcp, No   Assessment & Plan:  Chief Complaint:  Chief Complaint  Patient presents with   Right Wrist - Follow-up    Right carpal tunnel release 03/30/2023   Visit Diagnoses:  1. Right carpal tunnel syndrome     Plan: Patient is a pleasant 22 year old who comes in today 4 weeks status post right carpal tunnel release 03/30/2023.  Patient has been doing well.  No complaints.  Examination of the right hand: The incision has not fully healed.  There is no actual dehiscence, drainage or evidence of cellulitis or infection.  Fingers warm well-perfused.  At this point, I would like to apply mupirocin and a Band-Aid today.  He will continue with Neosporin and a Band-Aid twice daily.  Follow-up in 2 weeks for recheck.  We have provided him with a work note to return to work tomorrow no lifting greater than 2 pounds to the right upper extremity.  Call with any concerns or questions in the meantime.  Follow-Up Instructions: Return in about 2 weeks (around 05/11/2023).   Orders:  No orders of the defined types were placed in this encounter.  No orders of the defined types were placed in this encounter.   Imaging: No new imaging  PMFS History: Patient Active Problem List   Diagnosis Date Noted   Right carpal tunnel syndrome 02/03/2023   Left carpal tunnel syndrome 02/03/2023   Carpal tunnel syndrome 01/11/2023   Wrist pain 12/20/2022   Neuropathy 10/27/2021   Low back pain 09/02/2021   Contusion of left great toe without damage to nail 09/02/2021   Hyperkeratosis 05/16/2020   Daytime somnolence 02/29/2020   Mild intermittent asthma without complication 07/06/2017   Hypersomnia 07/28/2016   Acanthosis nigricans 04/01/2016   Obstructive sleep apnea 08/27/2013   Seasonal allergies    Obesity 01/01/2013   Past Medical History:  Diagnosis Date    Acid reflux disease 2021   was on famotidine but stopped after improvement   Allergy    Asthma 2007   Asthma exacerbation 11/17/2012   no current problems no inhaler   Development delay    Karyotype normal, fragile X normal   Early puberty, male 2009   at 7 years, familial   Impaired speech articulation 2011   Inadequate social skills 2011   Myopia 2010   Newborn exposure to maternal syphilis 2002   treated during preg   Obesity 2010   Pre-diabetes    Seasonal allergies 2007   Sleep apnea    uses CPAP nightly    Family History  Problem Relation Age of Onset   Diabetes Mother    Hyperlipidemia Mother    Asthma Father    Cancer Father    Depression Father    Diabetes Father    Hyperlipidemia Father    Hypertension Father    Asthma Sister    Asthma Brother    Birth defects Brother     Past Surgical History:  Procedure Laterality Date   ADENOIDECTOMY     22 years old   CARPAL TUNNEL RELEASE Right 03/30/2023   Procedure: RIGHT CARPAL TUNNEL RELEASE;  Surgeon: Tarry Kos, MD;  Location: [facility];  Service: Orthopedics;  Laterality: Right;   TONSILECTOMY, ADENOIDECTOMY, BILATERAL MYRINGOTOMY AND TUBES  TYMPANOSTOMY TUBE PLACEMENT  2004   Social History   Occupational History   Not on file  Tobacco Use   Smoking status: Never   Smokeless tobacco: Never  Vaping Use   Vaping status: Never Used  Substance and Sexual Activity   Alcohol use: Never   Drug use: Never   Sexual activity: Never

## 2023-05-12 ENCOUNTER — Ambulatory Visit: Payer: MEDICAID | Admitting: Orthopaedic Surgery

## 2023-05-12 ENCOUNTER — Encounter: Payer: Self-pay | Admitting: Orthopaedic Surgery

## 2023-05-12 DIAGNOSIS — R202 Paresthesia of skin: Secondary | ICD-10-CM

## 2023-05-12 DIAGNOSIS — Z9889 Other specified postprocedural states: Secondary | ICD-10-CM | POA: Diagnosis not present

## 2023-05-12 DIAGNOSIS — R200 Anesthesia of skin: Secondary | ICD-10-CM

## 2023-05-12 NOTE — Progress Notes (Signed)
Office Visit Note   Patient: Bob [name]           Date of Birth: 2000/12/31           MRN: [id number] Visit Date: 05/12/2023              Requested by: No referring provider defined for this encounter. PCP: Pcp, No   Assessment & Plan: Visit Diagnoses:  1. S/P carpal tunnel release   2. Numbness and tingling in right hand     Plan: Bob [name] is status post right carpal tunnel release.  He is has done well from this.  He is released to activity as tolerated.  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  Disease process explained and treatment options were reviewed.  For now he will just live with it.  Will see him back as needed.  Follow-Up Instructions: No follow-ups on file.   Orders:  No orders of the defined types were placed in this encounter.  No orders of the defined types were placed in this encounter.     Procedures: No procedures performed   Clinical Data: No additional findings.   Subjective: Chief Complaint  Patient presents with   Right Wrist - Follow-up    Right carpal tunnel release 03/30/2023    HPI Bob [name] is a 22 year old gentleman who is 6 weeks status post right carpal tunnel release and also was here to be evaluated for a separate problem of numbness and tingling in the ring and small fingers.  Denies any injuries.  Does not wake him up at night. Review of Systems   Objective: Vital Signs: There were no vitals taken for this visit.  Physical Exam  Ortho Exam Exam of the right hand shows fully healed surgical scar.  No sensory deficits in the fingertips.  Reports subjective dysesthesias in the ring and small fingers. Specialty Comments:  No specialty comments available.  Imaging: No results found.   PMFS History: Patient Active Problem List   Diagnosis Date Noted   Right carpal tunnel syndrome 02/03/2023   Left carpal tunnel syndrome 02/03/2023   Carpal tunnel syndrome  01/11/2023   Wrist pain 12/20/2022   Neuropathy 10/27/2021   Low back pain 09/02/2021   Contusion of left great toe without damage to nail 09/02/2021   Hyperkeratosis 05/16/2020   Daytime somnolence 02/29/2020   Mild intermittent asthma without complication 07/06/2017   Hypersomnia 07/28/2016   Acanthosis nigricans 04/01/2016   Obstructive sleep apnea 08/27/2013   Seasonal allergies    Obesity 01/01/2013   Past Medical History:  Diagnosis Date   Acid reflux disease 2021   was on famotidine but stopped after improvement   Allergy    Asthma 2007   Asthma exacerbation 11/17/2012   no current problems no inhaler   Development delay    Karyotype normal, fragile X normal   Early puberty, male 2009   at 7 years, familial   Impaired speech articulation 2011   Inadequate social skills 2011   Myopia 2010   Newborn exposure to maternal syphilis 2002   treated during preg   Obesity 2010   Pre-diabetes    Seasonal allergies 2007   Sleep apnea    uses CPAP nightly    Family History  Problem Relation Age of Onset   Diabetes Mother    Hyperlipidemia Mother    Asthma Father    Cancer Father    Depression Father  Diabetes Father    Hyperlipidemia Father    Hypertension Father    Asthma Sister    Asthma Brother    Birth defects Brother     Past Surgical History:  Procedure Laterality Date   ADENOIDECTOMY     22 years old   CARPAL TUNNEL RELEASE Right 03/30/2023   Procedure: RIGHT CARPAL TUNNEL RELEASE;  Surgeon: Tarry Kos, MD;  Location: [facility];  Service: Orthopedics;  Laterality: Right;   TONSILECTOMY, ADENOIDECTOMY, BILATERAL MYRINGOTOMY AND TUBES     TYMPANOSTOMY TUBE PLACEMENT  2004   Social History   Occupational History   Not on file  Tobacco Use   Smoking status: Never   Smokeless tobacco: Never  Vaping Use   Vaping status: Never Used  Substance and Sexual Activity   Alcohol use: Never   Drug use: Never   Sexual activity: Never

## 2023-05-12 NOTE — Progress Notes (Deleted)
   Post-Op Visit Note   Patient: Sargent Gapp           Date of Birth: 06-Feb-2001           MRN: [id number] Visit Date: 05/12/2023 PCP: Pcp, No   Assessment & Plan:  Chief Complaint: No chief complaint on file.  Visit Diagnoses:  1. S/P carpal tunnel release     Plan: ***  Follow-Up Instructions: No follow-ups on file.   Orders:  No orders of the defined types were placed in this encounter.  No orders of the defined types were placed in this encounter.   Imaging: No results found.  PMFS History: Patient Active Problem List   Diagnosis Date Noted   Right carpal tunnel syndrome 02/03/2023   Left carpal tunnel syndrome 02/03/2023   Carpal tunnel syndrome 01/11/2023   Wrist pain 12/20/2022   Neuropathy 10/27/2021   Low back pain 09/02/2021   Contusion of left great toe without damage to nail 09/02/2021   Hyperkeratosis 05/16/2020   Daytime somnolence 02/29/2020   Mild intermittent asthma without complication 07/06/2017   Hypersomnia 07/28/2016   Acanthosis nigricans 04/01/2016   Obstructive sleep apnea 08/27/2013   Seasonal allergies    Obesity 01/01/2013   Past Medical History:  Diagnosis Date   Acid reflux disease 2021   was on famotidine but stopped after improvement   Allergy    Asthma 2007   Asthma exacerbation 11/17/2012   no current problems no inhaler   Development delay    Karyotype normal, fragile X normal   Early puberty, male 2009   at 7 years, familial   Impaired speech articulation 2011   Inadequate social skills 2011   Myopia 2010   Newborn exposure to maternal syphilis 2002   treated during preg   Obesity 2010   Pre-diabetes    Seasonal allergies 2007   Sleep apnea    uses CPAP nightly    Family History  Problem Relation Age of Onset   Diabetes Mother    Hyperlipidemia Mother    Asthma Father    Cancer Father    Depression Father    Diabetes Father    Hyperlipidemia Father    Hypertension Father    Asthma Sister    Asthma  Brother    Birth defects Brother     Past Surgical History:  Procedure Laterality Date   ADENOIDECTOMY     22 years old   CARPAL TUNNEL RELEASE Right 03/30/2023   Procedure: RIGHT CARPAL TUNNEL RELEASE;  Surgeon: Tarry Kos, MD;  Location: [facility];  Service: Orthopedics;  Laterality: Right;   TONSILECTOMY, ADENOIDECTOMY, BILATERAL MYRINGOTOMY AND TUBES     TYMPANOSTOMY TUBE PLACEMENT  2004   Social History   Occupational History   Not on file  Tobacco Use   Smoking status: Never   Smokeless tobacco: Never  Vaping Use   Vaping status: Never Used  Substance and Sexual Activity   Alcohol use: Never   Drug use: Never   Sexual activity: Never

## 2023-07-20 ENCOUNTER — Other Ambulatory Visit (HOSPITAL_COMMUNITY): Payer: Self-pay

## 2023-07-21 ENCOUNTER — Other Ambulatory Visit: Payer: Self-pay

## 2023-07-25 ENCOUNTER — Other Ambulatory Visit: Payer: Self-pay

## 2023-07-29 ENCOUNTER — Encounter: Payer: Self-pay | Admitting: Orthopaedic Surgery

## 2023-07-29 ENCOUNTER — Ambulatory Visit (INDEPENDENT_AMBULATORY_CARE_PROVIDER_SITE_OTHER): Payer: MEDICAID | Admitting: Orthopaedic Surgery

## 2023-07-29 DIAGNOSIS — M654 Radial styloid tenosynovitis [de Quervain]: Secondary | ICD-10-CM | POA: Diagnosis not present

## 2023-07-29 MED ORDER — LIDOCAINE HCL 1 % IJ SOLN
0.3000 mL | INTRAMUSCULAR | Status: AC | PRN
Start: 2023-07-29 — End: 2023-07-29
  Administered 2023-07-29: .3 mL

## 2023-07-29 MED ORDER — BUPIVACAINE HCL 0.5 % IJ SOLN
0.3300 mL | INTRAMUSCULAR | Status: AC | PRN
Start: 2023-07-29 — End: 2023-07-29
  Administered 2023-07-29: .33 mL

## 2023-07-29 MED ORDER — METHYLPREDNISOLONE ACETATE 40 MG/ML IJ SUSP
13.3300 mg | INTRAMUSCULAR | Status: AC | PRN
Start: 2023-07-29 — End: 2023-07-29
  Administered 2023-07-29: 13.33 mg

## 2023-07-29 NOTE — Progress Notes (Signed)
 Office Visit Note   Patient: Rob Mciver           Date of Birth: [birth year]           MRN: [id number] Visit Date: 07/29/2023              Requested by: No referring provider defined for this encounter. PCP: Pcp, No   Assessment & Plan: Visit Diagnoses:  1. De Quervain's tenosynovitis, left     Plan: Impression is left wrist de Quervain's tenosynovitis.  Thumb spica brace provided.  Cortisone injection performed today.  Work note provided.  Questions encouraged and answered.  Mother was available over the phone.  Follow-Up Instructions: No follow-ups on file.   Orders:  Orders Placed This Encounter  Procedures   Hand/UE Inj   No orders of the defined types were placed in this encounter.     Procedures: Hand/UE Inj: L extensor compartment 1 for de Quervain's tenosynovitis on 07/29/2023 8:13 AM Indications: pain Details: 25 G needle Medications: 0.3 mL lidocaine  1 %; 0.33 mL bupivacaine  0.5 %; 13.33 mg methylPREDNISolone  acetate 40 MG/ML Outcome: tolerated well, no immediate complications Patient was prepped and draped in the usual sterile fashion.       Clinical Data: No additional findings.   Subjective: Chief Complaint  Patient presents with   Left Wrist - Pain    HPI Mccade is a 23 year old gentleman here for evaluation of left wrist pain for months.  He has pain when using his hands and lifting.  Voltaren  gel does not help.  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 Review of Systems  Constitutional: Negative.   HENT: Negative.    Eyes: Negative.   Respiratory: Negative.    Cardiovascular: Negative.   Gastrointestinal: Negative.   Endocrine: Negative.   Genitourinary: Negative.   Skin: Negative.   Allergic/Immunologic: Negative.   Neurological: Negative.   Hematological: Negative.   Psychiatric/Behavioral: Negative.    All other systems reviewed and are negative.    Objective: Vital Signs: There were no vitals taken for this  visit.  Physical Exam Vitals and nursing note reviewed.  Constitutional:      Appearance: She is well-developed.  HENT:     Head: Normocephalic and atraumatic.  Eyes:     Pupils: Pupils are equal, round, and reactive to light.  Pulmonary:     Effort: Pulmonary effort is normal.  Abdominal:     Palpations: Abdomen is soft.  Musculoskeletal:        General: Normal range of motion.     Cervical back: Neck supple.  Skin:    General: Skin is warm.  Neurological:     Mental Status: She is alert and oriented to person, place, and time.  Psychiatric:        Behavior: Behavior normal.        Thought Content: Thought content normal.        Judgment: Judgment normal.     Ortho Exam Examination of the left wrist shows severe tenderness over the radial styloid.  Positive Finkelstein's. Specialty Comments:  No specialty comments available.  Imaging: No results found.   PMFS History: Patient Active Problem List   Diagnosis Date Noted   De Quervain's tenosynovitis, left 07/29/2023   Right carpal tunnel syndrome 02/03/2023   Left carpal tunnel syndrome 02/03/2023   Carpal tunnel syndrome 01/11/2023   Wrist pain 12/20/2022   Neuropathy 10/27/2021   Low back pain 09/02/2021   Contusion of  left great toe without damage to nail 09/02/2021   Hyperkeratosis 05/16/2020   Daytime somnolence 02/29/2020   Mild intermittent asthma without complication 07/06/2017   Hypersomnia 07/28/2016   Acanthosis nigricans 04/01/2016   Obstructive sleep apnea 08/27/2013   Seasonal allergies    Obesity 01/01/2013   Past Medical History:  Diagnosis Date   Acid reflux disease 2021   was on famotidine  but stopped after improvement   Allergy     Asthma 2007   Asthma exacerbation 11/17/2012   no current problems no inhaler   Development delay    Karyotype normal, fragile X normal   Early puberty, male 2009   at 7 years, familial   Impaired speech articulation 2011   Inadequate social skills 2011    Myopia 2010   Newborn exposure to maternal syphilis 2002   treated during preg   Obesity 2010   Pre-diabetes    Seasonal allergies 2007   Sleep apnea    uses CPAP nightly    Family History  Problem Relation Age of Onset   Diabetes Mother    Hyperlipidemia Mother    Asthma Father    Cancer Father    Depression Father    Diabetes Father    Hyperlipidemia Father    Hypertension Father    Asthma Sister    Asthma Brother    Birth defects Brother     Past Surgical History:  Procedure Laterality Date   ADENOIDECTOMY     24 years old   CARPAL TUNNEL RELEASE Right 03/30/2023   Procedure: RIGHT CARPAL TUNNEL RELEASE;  Surgeon: Jerri Kay HERO, MD;  Location: [facility];  Service: Orthopedics;  Laterality: Right;   TONSILECTOMY, ADENOIDECTOMY, BILATERAL MYRINGOTOMY AND TUBES     TYMPANOSTOMY TUBE PLACEMENT  2004   Social History   Occupational History   Not on file  Tobacco Use   Smoking status: Never   Smokeless tobacco: Never  Vaping Use   Vaping status: Never Used  Substance and Sexual Activity   Alcohol use: Never   Drug use: Never   Sexual activity: Never

## 2023-08-10 ENCOUNTER — Ambulatory Visit (INDEPENDENT_AMBULATORY_CARE_PROVIDER_SITE_OTHER): Payer: MEDICAID | Admitting: Orthopaedic Surgery

## 2023-08-10 DIAGNOSIS — G5602 Carpal tunnel syndrome, left upper limb: Secondary | ICD-10-CM

## 2023-08-10 DIAGNOSIS — M654 Radial styloid tenosynovitis [de Quervain]: Secondary | ICD-10-CM

## 2023-08-10 NOTE — Progress Notes (Signed)
Office Visit Note   Patient: Bob [name]           Date of Birth: [birth year]           MRN: [id number] Visit Date: 08/10/2023              Requested by: No referring provider defined for this encounter. PCP: Pcp, No   Assessment & Plan: Visit Diagnoses:  1. De Quervain's tenosynovitis, left   2. Left carpal tunnel syndrome     Plan: Bob [name] is a 23 year old gentleman with resolved left de Quervain's tenosynovitis status post immobilization and cortisone injection.  Work note provided today.  In regards to the carpal tunnel syndrome he would like to get this done in May.  Eunice Blase will call him to confirm surgery date.  Follow-Up Instructions: No follow-ups on file.   Orders:  No orders of the defined types were placed in this encounter.  No orders of the defined types were placed in this encounter.     Procedures: No procedures performed   Clinical Data: No additional findings.   Subjective: Chief Complaint  Patient presents with   Left Wrist - Follow-up   Right Wrist - Follow-up    Right carpal tunnel release 03/30/2023    HPI Bob [name] follows up today for left de Quervain's tenosynovitis and left carpal tunnel syndrome.  He is feeling much better with the de Quervain's and would like to be released back to work. Review of Systems   Objective: Vital Signs: There were no vitals taken for this visit.  Physical Exam  Ortho Exam Examination of the left hand and wrist shows no pain with wrist range of motion or Finkelstein's. Specialty Comments:  No specialty comments available.  Imaging: No results found.   PMFS History: Patient Active Problem List   Diagnosis Date Noted   De Quervain's tenosynovitis, left 07/29/2023   Right carpal tunnel syndrome 02/03/2023   Left carpal tunnel syndrome 02/03/2023   Carpal tunnel syndrome 01/11/2023   Wrist pain 12/20/2022   Neuropathy 10/27/2021   Low back pain 09/02/2021   Contusion of left great toe without damage  to nail 09/02/2021   Hyperkeratosis 05/16/2020   Daytime somnolence 02/29/2020   Mild intermittent asthma without complication 07/06/2017   Hypersomnia 07/28/2016   Acanthosis nigricans 04/01/2016   Obstructive sleep apnea 08/27/2013   Seasonal allergies    Obesity 01/01/2013   Past Medical History:  Diagnosis Date   Acid reflux disease 2021   was on famotidine but stopped after improvement   Allergy    Asthma 2007   Asthma exacerbation 11/17/2012   no current problems no inhaler   Development delay    Karyotype normal, fragile X normal   Early puberty, male 2009   at 7 years, familial   Impaired speech articulation 2011   Inadequate social skills 2011   Myopia 2010   Newborn exposure to maternal syphilis 10/11/2000   treated during preg   Obesity 2010   Pre-diabetes    Seasonal allergies 2007   Sleep apnea    uses CPAP nightly    Family History  Problem Relation Age of Onset   Diabetes Mother    Hyperlipidemia Mother    Asthma Father    Cancer Father    Depression Father    Diabetes Father    Hyperlipidemia Father    Hypertension Father    Asthma Sister    Asthma Brother    Birth defects Brother  Past Surgical History:  Procedure Laterality Date   ADENOIDECTOMY     23 years old   CARPAL TUNNEL RELEASE Right 03/30/2023   Procedure: RIGHT CARPAL TUNNEL RELEASE;  Surgeon: Tarry Kos, MD;  Location: [facility];  Service: Orthopedics;  Laterality: Right;   TONSILECTOMY, ADENOIDECTOMY, BILATERAL MYRINGOTOMY AND TUBES     TYMPANOSTOMY TUBE PLACEMENT  2004   Social History   Occupational History   Not on file  Tobacco Use   Smoking status: Never   Smokeless tobacco: Never  Vaping Use   Vaping status: Never Used  Substance and Sexual Activity   Alcohol use: Never   Drug use: Never   Sexual activity: Never

## 2023-09-15 ENCOUNTER — Encounter: Payer: Self-pay | Admitting: Student

## 2023-09-15 ENCOUNTER — Ambulatory Visit: Payer: MEDICAID | Admitting: Student

## 2023-09-15 ENCOUNTER — Other Ambulatory Visit (HOSPITAL_COMMUNITY): Payer: Self-pay

## 2023-09-15 DIAGNOSIS — Z23 Encounter for immunization: Secondary | ICD-10-CM

## 2023-09-15 DIAGNOSIS — Z91010 Allergy to peanuts: Secondary | ICD-10-CM | POA: Diagnosis not present

## 2023-09-15 DIAGNOSIS — J302 Other seasonal allergic rhinitis: Secondary | ICD-10-CM

## 2023-09-15 DIAGNOSIS — Q828 Other specified congenital malformations of skin: Secondary | ICD-10-CM | POA: Diagnosis not present

## 2023-09-15 LAB — POCT GLYCOSYLATED HEMOGLOBIN (HGB A1C): Hemoglobin A1C: 5.7 % — AB (ref 4.0–5.6)

## 2023-09-15 MED ORDER — FLUTICASONE PROPIONATE 50 MCG/ACT NA SUSP
2.0000 | Freq: Every day | NASAL | 6 refills | Status: AC
  Filled 2023-09-15: qty 16, 30d supply, fill #0
  Filled 2023-10-27 – 2023-10-28 (×2): qty 16, 30d supply, fill #1
  Filled 2023-11-28: qty 16, 30d supply, fill #2
  Filled 2024-01-19: qty 16, 30d supply, fill #3
  Filled 2024-05-15: qty 16, 30d supply, fill #4
  Filled 2024-07-18: qty 16, 30d supply, fill #5

## 2023-09-15 MED ORDER — TIRZEPATIDE-WEIGHT MANAGEMENT 2.5 MG/0.5ML ~~LOC~~ SOAJ
2.5000 mg | SUBCUTANEOUS | 1 refills | Status: DC
Start: 2023-09-15 — End: 2023-09-21
  Filled 2023-09-15: qty 2, 28d supply, fill #0

## 2023-09-15 MED ORDER — CETIRIZINE HCL 10 MG PO TABS
10.0000 mg | ORAL_TABLET | Freq: Every evening | ORAL | 1 refills | Status: AC | PRN
  Filled 2023-09-15: qty 30, 30d supply, fill #0
  Filled 2024-01-09: qty 30, 30d supply, fill #1
  Filled 2024-01-19 – 2024-04-19 (×2): qty 30, 30d supply, fill #2
  Filled 2024-06-18: qty 30, 30d supply, fill #3
  Filled 2024-08-24: qty 30, 30d supply, fill #4

## 2023-09-15 MED ORDER — EPINEPHRINE 0.3 MG/0.3ML IJ SOAJ
0.3000 mg | INTRAMUSCULAR | 1 refills | Status: AC | PRN
  Filled 2023-09-15: qty 2, 30d supply, fill #0

## 2023-09-15 NOTE — Patient Instructions (Signed)
 Bob [name] to meet you!  Let's get you back on Zepbound! I have ordered it and will do a dance with insurance. I am sending in an EpiPen and referral to allergy for your peanut intolerance. I am sending a referral to podiatry for your feetl. We will check you A1c on the way out the door.  Bob Mccoy, MD

## 2023-09-15 NOTE — Progress Notes (Cosign Needed Addendum)
    SUBJECTIVE:   CHIEF COMPLAINT / HPI:   Peanut intolerance Has noticed that he seems to be getting progressively more intolerant of peanuts over the past several months.  Started with just some abdominal pain and has progressed to diarrhea and seems to be becoming more of a systemic response to peanuts.  He is obviously concerned about this and would like to be seen by an allergist for formal testing.  Hyperkeratosis of foot Was having a pedicure and asked the pedicurist to remove some of the skin buildup on the bottom of his foot.  They suggested that this would be better handled by a physician.  It is not particularly bothersome to him but does make him feel like he is walking on a "mound."  Morbid obesity Was previously prescribed Zepbound, but there was some issue where it was being sent to the wrong pharmacy (somewhere in [place]?) so has not actually been on this, or any medication for weight loss. Has been working hard on weight loss with diet and exercise without adequate response. Is worried about this and the effect on his overall health. Has OSA and prediabetes as sequela of his obesity.    OBJECTIVE:   BP (!) 97/42   Pulse 100   Ht 5\' 6"  (1.676 m)   Wt (!) 397 lb 6.4 oz (180.3 kg)   SpO2 97%   BMI 64.14 kg/m   Gen: Engaged and NAD  HENT: MMM Cardio: RRR Pulm: Normal WOB on RA, speaking in full sentences MSK: Left foot with hyperkeratotic buildup to the sole of the foot without discrete lesion, ulceration, or fissure.     ASSESSMENT/PLAN:   Assessment & Plan Morbid obesity (HCC) Obesity complicated by OSA and prediabetes.  A1c today remains of 5.7%.  Inadequate response with diet and exercise alone.  Certainly would benefit from addition of a GLP-1 agent.  Hopefully we can sort out the pharmacy issue that he was having last year with his pound. -Restart Zepbound 2.5 mg weekly -Follow-up in 4 weeks Hyperkeratosis of soles Longstanding, over 4 years now. Not  associated with odor or itch or excessive pitting to suggest pitting keratolysis, but empiric treatment could be considered moving forward.  -Referral to podiatry for management Peanut allergy Started off as GI upset, now seems to be progressing to more systemic illness.  No history of anaphylaxis.  Does have a history of fairly significant seasonal allergies. -Referral to allergy and asthma -EpiPen given today with instructions for use with any difficulty breathing with future reaction -Continue daily Zyrtec    J Dorothyann Gibbs, MD [facility]

## 2023-09-19 ENCOUNTER — Telehealth: Payer: Self-pay

## 2023-09-19 DIAGNOSIS — E66813 Obesity, class 3: Secondary | ICD-10-CM

## 2023-09-19 NOTE — Telephone Encounter (Signed)
 Pharmacy Patient Advocate Encounter   Received notification from CoverMyMeds that prior authorization for ZEPBOUND 2.5MG  is required/requested.   Insurance verification completed.   The patient is insured through [facility] .   PA required; PA submitted to above mentioned insurance via CoverMyMeds Key/confirmation #/[id number]. Status is pending

## 2023-09-20 ENCOUNTER — Ambulatory Visit: Payer: MEDICAID | Admitting: Student

## 2023-09-20 NOTE — Telephone Encounter (Signed)
 Pharmacy Patient Advocate Encounter  Received notification from [facility] that Prior Authorization for [facility] has been DENIED.  Full denial letter will be uploaded to the media tab. See denial reason below.     PA #/Case ID/Reference #: [id number]

## 2023-09-21 ENCOUNTER — Other Ambulatory Visit (HOSPITAL_COMMUNITY): Payer: Self-pay

## 2023-09-21 MED ORDER — WEGOVY 0.25 MG/0.5ML ~~LOC~~ SOAJ
0.2500 mg | SUBCUTANEOUS | 0 refills | Status: DC
  Filled 2023-09-21 – 2023-09-29 (×2): qty 2, 28d supply, fill #0

## 2023-09-21 NOTE — Addendum Note (Signed)
 Addended by: Darnelle Spangle B on: 09/21/2023 12:07 PM   Modules accepted: Orders

## 2023-09-29 ENCOUNTER — Other Ambulatory Visit (HOSPITAL_COMMUNITY): Payer: Self-pay

## 2023-09-29 ENCOUNTER — Encounter: Payer: Self-pay | Admitting: Podiatry

## 2023-09-29 ENCOUNTER — Ambulatory Visit (INDEPENDENT_AMBULATORY_CARE_PROVIDER_SITE_OTHER): Payer: MEDICAID | Admitting: Podiatry

## 2023-09-29 DIAGNOSIS — L84 Corns and callosities: Secondary | ICD-10-CM | POA: Diagnosis not present

## 2023-09-29 DIAGNOSIS — B353 Tinea pedis: Secondary | ICD-10-CM

## 2023-09-29 MED ORDER — CLOTRIMAZOLE-BETAMETHASONE 1-0.05 % EX CREA
1.0000 | TOPICAL_CREAM | Freq: Two times a day (BID) | CUTANEOUS | 3 refills | Status: DC
Start: 2023-09-29 — End: 2024-05-08
  Filled 2023-09-29: qty 60, 30d supply, fill #0
  Filled 2023-10-27 – 2023-10-28 (×2): qty 60, 30d supply, fill #1
  Filled 2024-01-19: qty 45, 23d supply, fill #2

## 2023-09-29 NOTE — Patient Instructions (Signed)
 VISIT SUMMARY:  You visited [place] today for the evaluation of foot calluses that have been causing you discomfort for about a year. We discussed your symptoms, including the peeling skin between your toes, and reviewed your history of eczema. Based on our assessment, we have identified that your calluses are likely exacerbated by underlying eczema and a possible fungal infection (tinea pedis).  YOUR PLAN:  -CALLUSES WITH UNDERLYING ECZEMA AND TINEA PEDIS: Calluses are thickened areas of skin that develop due to pressure and friction, often from weight bearing and walking. Your calluses are likely worsened by eczema and a possible fungal infection. We have prescribed Lotrisone cream, which contains both a steroid and an antifungal, to be applied twice daily to the bottom of your foot, around the back of your heels, and between your toes. This should help manage both the eczema and the fungal infection. Additionally, we recommend proper foot hygiene, including thoroughly drying your feet after washing, using an antifungal spray inside your shoes daily, and alternating shoes to allow them to air out. You should also use a heavy-duty moisturizing lotion like O'Keeffe's after applying the prescription cream, letting the cream dry for 10-15 minutes before putting on socks. Wear white cotton socks and wash them on hot with bleach to prevent reinfection. Monitor for signs of toenail fungus and worsening athlete's foot, and consider oral medication if there is no improvement in 3-4 months.  INSTRUCTIONS:  Please follow the prescribed treatment plan and monitor your symptoms. If you do not see improvement in 3-4 months or if you notice signs of toenail fungus or worsening athlete's foot, please schedule a follow-up appointment for further evaluation and possible oral medication.

## 2023-09-29 NOTE — Progress Notes (Signed)
 Subjective:  Patient ID: Bob [name], male    DOB: 12-10-2000,  MRN: [id number]  Chief Complaint  Patient presents with   Callouses    Patient states he has Callouses on the bottom on left foot, patient would like doctor to look at the bilateral feet also and see what he thinks. Patient states this has been going on for a year now     Discussed the use of AI scribe software for clinical note transcription with the patient, who gave verbal consent to proceed.  History of Present Illness   The patient presents with foot calluses. He was referred by a previous doctor for evaluation of foot calluses.  He has had calluses on his feet for about a year, causing some discomfort but no bleeding, drainage, itching, or pain. He has not applied any treatments but has attempted to file them down. A pedicure led to advice to see a doctor, resulting in the current referral.   .  He has a history of eczema, for which he uses a steroid cream, though he does not recall the name of the cream. No family history of similar conditions.          Objective:    Physical Exam   VASCULAR: DP and PT pulse palpable. Foot is warm and well-perfused. Capillary fill time is brisk. DERMATOLOGIC: Dry, cracking, peeling skin with moccasin distribution on plantar foot and scaling on right foot. Peeling skin between toes. Normal skin turgor, texture, and temperature.  NEUROLOGIC: Normal sensation to light touch and pressure. No paresthesias. ORTHOPEDIC: Smooth, pain-free range of motion of all examined joints. No ecchymosis or bruising. No gross deformity. No pain to palpation.       No images are attached to the encounter.    Results          Assessment:   1. Tinea pedis of both feet   2. Callus of foot      Plan:  Patient was evaluated and treated and all questions answered.  Assessment and Plan    Calluses with underlying eczema and tinea pedis   Chronic calluses on the plantar aspect of the  foot, present for about a year, causing discomfort without bleeding or drainage. Likely exacerbated by underlying eczema and tinea pedis. Calluses result from pressure due to weight bearing and walking. Peeling skin between the toes suggests a fungal infection. The condition requires management to prevent progression. Lotrisone cream, containing a steroid and antifungal, is prescribed to address both eczema and fungal infection. Improvement is expected over a couple of months, though some callus may remain due to pressure.   - Prescribe Lotrisone cream (steroid and antifungal) to be applied twice daily to the bottom of the foot, around the back of the heels, and between the toes.   - Advise on proper foot hygiene: dry feet thoroughly after washing, especially between the toes.   - Recommend using an antifungal spray inside shoes daily and alternating shoes to allow airing out.   - Suggest purchasing a second pair of shoes to alternate daily.   - Advise using a heavy-duty moisturizing lotion like O'Keeffe's after applying the prescription cream.   - Instruct to let the cream dry for 10-15 minutes before putting on socks.   - Recommend wearing white cotton socks and washing them on hot with bleach to prevent reinfection.   - Monitor for signs of toenail fungus and worsening athlete's foot; consider oral medication if not improved in 3-4 months.  No follow-ups on file.

## 2023-10-07 ENCOUNTER — Ambulatory Visit: Payer: MEDICAID | Admitting: Adult Health

## 2023-10-07 ENCOUNTER — Other Ambulatory Visit (HOSPITAL_COMMUNITY): Payer: Self-pay

## 2023-10-07 ENCOUNTER — Encounter: Payer: Self-pay | Admitting: Adult Health

## 2023-10-07 VITALS — BP 124/86 | HR 77 | Ht 70.0 in | Wt >= 6400 oz

## 2023-10-07 DIAGNOSIS — J302 Other seasonal allergic rhinitis: Secondary | ICD-10-CM

## 2023-10-07 DIAGNOSIS — G4710 Hypersomnia, unspecified: Secondary | ICD-10-CM | POA: Diagnosis not present

## 2023-10-07 DIAGNOSIS — G4733 Obstructive sleep apnea (adult) (pediatric): Secondary | ICD-10-CM | POA: Diagnosis not present

## 2023-10-07 DIAGNOSIS — Z6841 Body Mass Index (BMI) 40.0 and over, adult: Secondary | ICD-10-CM

## 2023-10-07 DIAGNOSIS — E669 Obesity, unspecified: Secondary | ICD-10-CM | POA: Diagnosis not present

## 2023-10-07 MED ORDER — MODAFINIL 100 MG PO TABS
100.0000 mg | ORAL_TABLET | Freq: Every day | ORAL | 3 refills | Status: DC
  Filled 2023-10-07 – 2023-10-27 (×2): qty 30, 30d supply, fill #0
  Filled 2023-11-28: qty 30, 30d supply, fill #1
  Filled 2024-01-09: qty 30, 30d supply, fill #2

## 2023-10-07 NOTE — Assessment & Plan Note (Signed)
 Sleep apnea with hypersomnia.  Patient has good compliance on BiPAP.  Encouraged him to increase his nightly sleep closer to 8 hours.  And to use with naps if possible.  Healthy sleep regimen discussed in detail.  Patient was previously on Provigil.  Will restart Provigil to see if we can improve his symptom burden.  Begin Provigil 100 mg daily.  Patient patient was given.  Plan  Patient Instructions  Continue on BIPAP At bedtime and with naps wear all night long. Do not sleep without it. Try to get 8hr of sleep each night.  Restart Provigil 100mg  daily.  Keep up good work.  Do not drive if sleepy  Work on healthy weight loss.  Discuss with PCP weight loss options.  Order for mask fitting -recommend Dreamwear Full face.  Follow up in 3 months with Dr. Wynona Neat or Ch[place]wuebuka Churchill NP and As needed

## 2023-10-07 NOTE — Patient Instructions (Addendum)
 Continue on BIPAP At bedtime and with naps wear all night long. Do not sleep without it. Try to get 8hr of sleep each night.  Restart Provigil 100mg  daily.  Keep up good work.  Do not drive if sleepy  Work on healthy weight loss.  Discuss with PCP weight loss options.  Order for mask fitting -recommend Dreamwear Full face.  Follow up in 3 months with Dr. Wynona Neat or Mckynna Vanloan NP and As needed

## 2023-10-07 NOTE — Assessment & Plan Note (Signed)
Continue Zyrtec and Flonase as needed 

## 2023-10-07 NOTE — Assessment & Plan Note (Signed)
 Morbid obesity with BMI 57.  Continue follow-up with primary care and discussion of weight loss options.  Healthy diet and exercise

## 2023-10-07 NOTE — Addendum Note (Signed)
 Addended by: Maisie Fus on: 10/07/2023 11:35 AM   Modules accepted: Orders

## 2023-10-07 NOTE — Assessment & Plan Note (Signed)
 Severe obstructive sleep apnea with excellent compliance on BiPAP.  Encouraged patient to increase nightly usage to 8 hours if possible.  Use with naps.  Try to avoid times where he sleeps without his BiPAP. Needs to change BiPAP mask to help decrease leaks and residual events.   Plan  Patient Instructions  Continue on BIPAP At bedtime and with naps wear all night long. Do not sleep without it. Try to get 8hr of sleep each night.  Restart Provigil 100mg  daily.  Keep up good work.  Do not drive if sleepy  Work on healthy weight loss.  Discuss with PCP weight loss options.  Order for mask fitting -recommend Dreamwear Full face.  Follow up in 3 months with Dr. Wynona Neat or Cyndia Degraff NP and As needed

## 2023-10-07 NOTE — Progress Notes (Signed)
 @Patient  ID: Bob [name], male    DOB: [birth year], 23 y.o.   MRN: [id number]  Chief Complaint  Patient presents with   Follow-up    Referring provider: No ref. provider found  HPI: 23 year old male followed for severe obstructive sleep apnea with hypersomnia and mild intermittent asthma and allergic rhinitis Medical history significant for morbid obesity with BMI greater than 57  TEST/EVENTS :  Sleep study 09/2018- Severe obstructive sleep apnea/hypopnea syndrome, AHI 85.3/ hr.-Failed CPAP titration, BIPAP -optimal 22/18 cmH2O   Bipap 03/11/19 to 04/09/19 >> used on 26 of 30 nights with average 4 hrs 35 min.  Average AHI 52.8 with Bipap 22/18 cm H2O.  Lots of air leak.  10/07/2023 Follow up : OSA,Hypersomnia Asthma, AR Patient presents for a 1 year follow-up.  Patient has severe obstructive sleep apnea.  He is on nocturnal BiPAP.  Patient says he wears his BiPAP every single night.  Feels that he benefits from previous daytime sleepiness.  BiPAP download shows excellent compliance with daily average usage at 7 hours.  Patient is on auto BiPAP IPAP max 25, EPAP minimum 9, pressure support 5 cm H2O.  AHI 11/hour. This is improved from last year. Positive mask leaks. Patient complains that he needs any type of mask as  current mask is very uncomfortable, leaks all the time.  Has daytime sleepiness.  Has history of hypersomnia previously was on Provigil has been off for the last 2 years.  Would like to restart.  Feels that it may have helped some definitely worse since being off of this.  We did talk today at he is only getting an average of 5-6 and 1/2 to 7 hours and there are times where he falls asleep without his BiPAP.  Encouraged him to increase his nightly usage of BiPAP to 8 hours and to use BiPAP with naps if taken during the daytime. Is working on weight loss.  Currently looking into Zepbound and [place] with his primary care provider.  Currently waiting on insurance approval.  Uses  Zyrtec and Flonase as needed for allergies.  No recent flare.  No significant cough or wheezing.   Allergies  Allergen Reactions   Black Buyer, retail (Non-Screening) Other (See Comments)    Asphyxiation    Immunization History  Administered Date(s) Administered   DTaP 09/13/2000, 11/25/2000, 02/08/2001, 10/25/2001, 09/30/2004   HIB (PRP-OMP) 09/13/2000, 11/25/2000, 02/08/2001, 10/25/2001   HPV 9-valent 08/07/2014   HPV Quadrivalent 08/31/2011, 11/17/2012   Hepatitis A 12/14/2005, 02/21/2007   Hepatitis B 2000/08/28, 02/08/2001, 10/12/2005   IPV 09/13/2000, 11/25/2000, 08/02/2001, 09/30/2004   Influenza Nasal 08/31/2011   Influenza Split 05/29/2005, 05/12/2006, 05/13/2007, 04/16/2008, 04/22/2009, 04/28/2010, 05/02/2019   Influenza, Seasonal, Injecte, Preservative Fre 09/15/2023   Influenza,inj,Quad PF,6+ Mos 08/22/2015, 04/01/2016, 07/05/2017   Influenza,inj,quad, With Preservative 08/07/2014   Influenza-Unspecified 05/02/2020   MMR 08/02/2001, 09/30/2004   Meningococcal Conjugate 08/31/2011, 01/04/2017   Moderna SARS-COV2 Booster Vaccination 08/15/2020   Moderna Sars-Covid-2 Vaccination 01/17/2020, 02/14/2020   Pfizer(Comirnaty)Fall Seasonal Vaccine 12 years and older 09/15/2023   Pneumococcal Conjugate-13 09/13/2000, 11/25/2000, 02/08/2001, 08/02/2002   Tdap 08/31/2011   Varicella 08/02/2001, 12/14/2005    Past Medical History:  Diagnosis Date   Acid reflux disease 2021   was on famotidine but stopped after improvement   Allergy    Asthma 2007   Asthma exacerbation 11/17/2012   no current problems no inhaler   Development delay    Karyotype normal, fragile X normal   Early puberty, male 2009  at 7 years, familial   Impaired speech articulation 2011   Inadequate social skills 2011   Myopia 2010   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 90 tablet 1   clotrimazole-betamethasone (LOTRISONE) cream Apply 1 Application topically 2 (two) times daily. 60 g 3   diclofenac Sodium (VOLTAREN) 1 % GEL Apply 4 grams topically 4 (four) times daily. (Patient taking differently: Apply 1 Application topically 4 (four) times daily as needed (joint pain).) 100 g 1   EPINEPHrine 0.3 mg/0.3 mL IJ SOAJ injection Inject 0.3 mg into the muscle as needed for anaphylaxis. 2 each 1   fluticasone (FLONASE) 50 MCG/ACT nasal spray Place 2 sprays into both nostrils daily. 16 g 6   HYDROcodone-acetaminophen (NORCO) 5-325 MG tablet Take 1 tablet by mouth 3 (three) times daily as needed for moderate pain after surgery 20 tablet 0   ondansetron (ZOFRAN) 4 MG tablet Take 1 tablet (4 mg total) by mouth every 8 (eight) hours as needed for nausea or vomiting. (Patient not taking: Reported on 10/07/2023) 20 tablet 0   Semaglutide-Weight Management (WEGOVY) 0.25 MG/0.5ML SOAJ Inject 0.25 mg into the skin once a week. (Patient not taking: Reported on 10/07/2023) 2 mL 0   No facility-administered medications prior to visit.     Review of Systems:   Constitutional:   No  weight loss, night sweats,  Fevers, chills, +fatigue, or  lassitude.  HEENT:   No headaches,  Difficulty swallowing,  Tooth/dental problems, or  Sore throat,                No sneezing, itching, ear ache, nasal congestion, post nasal drip,   CV:  No chest pain,  Orthopnea, PND, swelling in lower extremities, anasarca, dizziness, palpitations, syncope.   GI  No heartburn, indigestion, abdominal pain, nausea, vomiting, diarrhea, change in bowel habits, loss of appetite, bloody stools.    Resp: No chest wall deformity  Skin: no rash or lesions.  GU: no dysuria, change in color of urine, no urgency or frequency.  No flank pain, no hematuria   MS:  No joint pain or swelling.  No decreased range of motion.  No back pain.    Physical Exam  BP 124/86 (BP Location: Left Arm, Patient Position: Sitting, Cuff Size: Large)   Pulse 77   Ht 5\' 10"  (1.778 m)   Wt (!) 402 lb 3.2 oz (182.4 kg)   SpO2 97%   BMI 57.71 kg/m   GEN: A/Ox3; pleasant , NAD, BMI 57   HEENT:  [place]/AT,   NOSE-clear, THROAT-clear, no lesions, no postnasal drip or exudate noted.  Class IV MP airway,  NECK:  Supple w/ fair ROM; no JVD; normal carotid impulses w/o bruits; no thyromegaly or nodules palpated; no lymphadenopathy.    RESP  Clear  P & A; w/o, wheezes/ rales/ or rhonchi. no accessory muscle use, no dullness to percussion  CARD:  RRR, no m/r/g, no peripheral edema, pulses intact, no cyanosis or clubbing.  GI:  Soft & nt; nml bowel sounds; no organomegaly or masses detected.   Musco: Warm bil, no deformities or joint swelling noted.   Neuro: alert, no focal deficits noted.    Skin: Warm, no lesions or rashes    Lab Results:  CBC    Component Value Date/Time   WBC 5.4 03/30/2023 1215   RBC 4.66 03/30/2023 1215   HGB 13.0 03/30/2023 1215   HCT 40.5 03/30/2023 1215   PLT 230 03/30/2023 1215   MCV 86.9 03/30/2023 1215   MCH 27.9 03/30/2023 1215   MCHC 32.1 03/30/2023 1215   RDW 13.4 03/30/2023 1215   LYMPHSABS 2.2 06/22/2019 1825   MONOABS 0.6 06/22/2019 1825   EOSABS 0.1 06/22/2019 1825   BASOSABS 0.0 06/22/2019 1825    BMET    Component Value Date/Time   NA 139 02/14/2023 1306   NA 143 12/20/2022 1136   K 3.7 02/14/2023 1306   CL 108 02/14/2023 1306   CO2 22 02/14/2023 1306   GLUCOSE 99 02/14/2023 1306   BUN 14 02/14/2023 1306   BUN 13 12/20/2022 1136   CREATININE 0.96 02/14/2023 1306   CALCIUM 8.8 (L) 02/14/2023 1306   GFRNONAA >60 02/14/2023 1306   GFRAA >60  06/22/2019 1825    BNP    Component Value Date/Time   BNP 17.3 06/22/2019 1822    ProBNP No results found for: "PROBNP"  Imaging: No results found.  Administration History     None           No data to display          No results found for: "NITRICOXIDE"      Assessment & Plan:   Obstructive sleep apnea Severe obstructive sleep apnea with excellent compliance on BiPAP.  Encouraged patient to increase nightly usage to 8 hours if possible.  Use with naps.  Try to avoid times where he sleeps without his BiPAP. Needs to change BiPAP mask to help decrease leaks and residual events.   Plan  Patient Instructions  Continue on BIPAP At bedtime and with naps wear all night long. Do not sleep without it. Try to get 8hr of sleep each night.  Restart Provigil 100mg  daily.  Keep up good work.  Do not drive if sleepy  Work on healthy weight loss.  Discuss with PCP weight loss options.  Order for mask fitting -recommend Dreamwear Full face.  Follow up in 3 months with Dr. Wynona Neat or Shanley Furlough NP and As needed       Hypersomnia Sleep apnea with hypersomnia.  Patient has good compliance on BiPAP.  Encouraged him to increase his nightly sleep closer to 8 hours.  And to use with naps if possible.  Healthy sleep regimen discussed in detail.  Patient was previously on Provigil.  Will restart Provigil to see if we can improve his symptom burden.  Begin Provigil 100 mg daily.  Patient patient was given.  Plan  Patient Instructions  Continue on BIPAP At bedtime and with naps wear all night long. Do not sleep without it. Try to get 8hr of sleep each night.  Restart Provigil 100mg  daily.  Keep up good work.  Do not drive if sleepy  Work on healthy weight loss.  Discuss with PCP weight loss options.  Order for mask fitting -recommend Dreamwear Full face.  Follow up in 3 months with Dr. Wynona Neat or Lacye Mccarn NP and As needed       Obesity Morbid obesity with BMI 57.  Continue  follow-up with primary care and discussion of weight loss options.  Healthy diet and exercise  Seasonal allergies Continue Zyrtec and Flonase as needed     Rubye Oaks, NP 10/07/2023

## 2023-10-10 ENCOUNTER — Telehealth: Payer: Self-pay

## 2023-10-10 ENCOUNTER — Other Ambulatory Visit (HOSPITAL_COMMUNITY): Payer: Self-pay

## 2023-10-10 NOTE — Telephone Encounter (Signed)
*  Pulm  Pharmacy Patient Advocate Encounter   Received notification from CoverMyMeds that prior authorization for Provigil 100MG  tablets  is required/requested.   Insurance verification completed.   The patient is insured through [facility] .   Per test claim: PA required; PA submitted to above mentioned insurance via CoverMyMeds Key/confirmation #/EOC [id number] Status is pending

## 2023-10-11 ENCOUNTER — Other Ambulatory Visit (HOSPITAL_COMMUNITY): Payer: Self-pay

## 2023-10-11 NOTE — Telephone Encounter (Signed)
 Pharmacy Patient Advocate Encounter  Received notification from [facility] that Prior Authorization for Provigil 100mg  has been APPROVED from 10/10/2023 to 10/09/2024. Ran test claim, Copay is $4.00. This test claim was processed through [facility]- copay amounts may vary at other pharmacies due to pharmacy/plan contracts, or as the patient moves through the different stages of their insurance plan.

## 2023-10-19 ENCOUNTER — Encounter: Payer: Self-pay | Admitting: Internal Medicine

## 2023-10-19 ENCOUNTER — Ambulatory Visit (INDEPENDENT_AMBULATORY_CARE_PROVIDER_SITE_OTHER): Payer: MEDICAID | Admitting: Internal Medicine

## 2023-10-19 ENCOUNTER — Other Ambulatory Visit (HOSPITAL_COMMUNITY): Payer: Self-pay

## 2023-10-19 VITALS — BP 126/78 | HR 80 | Temp 98.1°F | Resp 24 | Ht 67.5 in | Wt 398.0 lb

## 2023-10-19 DIAGNOSIS — T781XXD Other adverse food reactions, not elsewhere classified, subsequent encounter: Secondary | ICD-10-CM

## 2023-10-19 DIAGNOSIS — E739 Lactose intolerance, unspecified: Secondary | ICD-10-CM | POA: Diagnosis not present

## 2023-10-19 DIAGNOSIS — Z8709 Personal history of other diseases of the respiratory system: Secondary | ICD-10-CM

## 2023-10-19 DIAGNOSIS — L2084 Intrinsic (allergic) eczema: Secondary | ICD-10-CM | POA: Diagnosis not present

## 2023-10-19 DIAGNOSIS — J3089 Other allergic rhinitis: Secondary | ICD-10-CM | POA: Diagnosis not present

## 2023-10-19 DIAGNOSIS — T781XXA Other adverse food reactions, not elsewhere classified, initial encounter: Secondary | ICD-10-CM

## 2023-10-19 MED ORDER — LACTASE 4500 UNITS PO TABS
ORAL_TABLET | ORAL | 5 refills | Status: DC
  Filled 2023-10-19: qty 30, fill #0
  Filled 2023-10-27 – 2024-01-19 (×3): qty 50, fill #0

## 2023-10-19 NOTE — Progress Notes (Signed)
 NEW PATIENT Date of Service/Encounter:  10/19/23 Referring provider: Westley Chandler, MD Primary care provider: Alicia Amel, MD  Subjective:  Bob [name] is a 23 y.o. male  presenting today for evaluation of food allergies  History obtained from: chart review and patient.   Discussed the use of AI scribe software for clinical note transcription with the patient, who gave verbal consent to proceed.  History of Present Illness Bob [name] is a 23 year old male who presents with concerns about reactions to peanuts and lactose-containing foods. He is accompanied by his mother, Bob [name].  He experiences diarrhea approximately 30 minutes after consuming foods containing peanuts. This reaction began a few months ago and occurs consistently with any peanut exposure. He has attempted to manage symptoms with Pepto-Bismol, though it is not very effective. He has not tried antihistamines like Benadryl or Zyrtec for these symptoms. No nausea, vomiting, or other gastrointestinal symptoms are reported.  He also experiences diarrhea after consuming lactose-containing foods such as cheese, milk, and yogurt. Symptoms typically begin the following day. He manages this by using lactose-free milk and ice cream, which do not cause symptoms. Small amounts of cheese or yogurt are sometimes tolerated without issue.  He has a history of food allergies, including a past allergy to eggs and dairy as a child, which he has since outgrown. He also mentions an allergy to black walnuts, though he rarely encounters them. He has no issues with other tree nuts like pecans, walnuts, hazelnuts, or pistachios.  He has a history of asthma but no longer experiences symptoms and does not use an inhaler. No coughing, wheezing, or shortness of breath are reported. HE believed he has outgrown his asthma.   He has a history of eczema, described as dry skin with a persistent patch on left leg.  He has seasonal allergies,  currently managed with daily cetirizine and Flonase, two sprays in each nostril daily. He reports no symptoms when adhering to this regimen. If he missing his medications he does not have any breakthrough symptoms.  No recurrent sinus or ear infections or issues with animals are reported.     Other allergy screening: Asthma: yes Rhino conjunctivitis: yes Food allergy:  as above  Medication allergy: no Hymenoptera allergy: no Urticaria: no Eczema:yes History of recurrent infections suggestive of immunodeficency: no Vaccinations are up to date.   Past Medical History: Past Medical History:  Diagnosis Date   Acid reflux disease 2021   was on famotidine but stopped after improvement   Allergy    Asthma 2007   Asthma exacerbation 11/17/2012   no current problems no inhaler   Development delay    Karyotype normal, fragile X normal   Early puberty, male 2009   at 7 years, familial   Impaired speech articulation 2011   Inadequate social skills 2011   Myopia 2010   Newborn exposure to maternal syphilis 2002   treated during preg   Obesity 2010   Pre-diabetes    Seasonal allergies 2007   Sleep apnea    uses CPAP nightly   Medication List:  Current Outpatient Medications  Medication Sig Dispense Refill   cetirizine (ZYRTEC) 10 MG tablet Take 1 tablet (10 mg total) by mouth at bedtime as needed for allergies. 90 tablet 1   clotrimazole-betamethasone (LOTRISONE) cream Apply 1 Application topically 2 (two) times daily. 60 g 3   EPINEPHrine 0.3 mg/0.3 mL IJ SOAJ injection Inject 0.3 mg into the muscle as needed for anaphylaxis. 2  each 1   fluticasone (FLONASE) 50 MCG/ACT nasal spray Place 2 sprays into both nostrils daily. 16 g 6   Lactase 4500 units TABS Take 1 tab prior to eating lactose containing dairy 50 tablet 5   diclofenac Sodium (VOLTAREN) 1 % GEL Apply 4 grams topically 4 (four) times daily. (Patient not taking: Reported on 10/19/2023) 100 g 1   modafinil (PROVIGIL) 100 MG  tablet Take 1 tablet (100 mg total) by mouth daily. (Patient not taking: Reported on 10/19/2023) 30 tablet 3   No current facility-administered medications for this visit.   Known Allergies:  Allergies  Allergen Reactions   Black Buyer, retail (Non-Screening) Other (See Comments)    Asphyxiation   Past Surgical History: Past Surgical History:  Procedure Laterality Date   ADENOIDECTOMY     23 years old   CARPAL TUNNEL RELEASE Right 03/30/2023   Procedure: RIGHT CARPAL TUNNEL RELEASE;  Surgeon: Tarry Kos, MD;  Location: [facility] OR;  Service: Orthopedics;  Laterality: Right;   TONSILECTOMY, ADENOIDECTOMY, BILATERAL MYRINGOTOMY AND TUBES     TYMPANOSTOMY TUBE PLACEMENT  2004   Family History: Family History  Problem Relation Age of Onset   Diabetes Mother    Hyperlipidemia Mother    Asthma Father    Cancer Father    Depression Father    Diabetes Father    Hyperlipidemia Father    Hypertension Father    Asthma Sister    Food Allergy Sister    Food Allergy Brother    Asthma Brother    Birth defects Brother    Social History: [place] lives apartment, laminate in the family room, carpet in bedroom.  Electric heating, central cooling.  Dog inside the house with access to bedroom.  No dust mite precautions.  No tobacco exposure.  Works as a Neurosurgeon..   ROS:  All other systems negative except as noted per HPI.  Objective:  Blood pressure 126/78, pulse 80, temperature 98.1 F (36.7 C), temperature source Temporal, resp. rate (!) 24, height 5' 7.5" (1.715 m), weight (!) 398 lb (180.5 kg), SpO2 98%. Body mass index is 61.42 kg/m. Physical Exam:  General Appearance:  Alert, cooperative, no distress, appears stated age  Head:  Normocephalic, without obvious abnormality, atraumatic  Eyes:  Conjunctiva clear, EOM's intact  Ears EACs normal bilaterally  Nose: Nares normal, normal mucosa, no visible anterior polyps, and septum midline  Throat: Lips, tongue  normal; teeth and gums normal, normal posterior oropharynx  Neck: Supple, symmetrical  Lungs:   clear to auscultation bilaterally, Respirations unlabored, no coughing  Heart:  regular rate and rhythm and no murmur, Appears well perfused  Extremities: No edema  Skin: lichenification on on left shin   Neurologic: No gross deficits   Diagnostics: None    Labs:  Lab Orders  No laboratory test(s) ordered today     Assessment and Plan  Assessment and Plan Assessment & Plan Peanut allergy Diarrhea post-peanut ingestion suggests food allergy or FPIES. Emphasized EpiPen necessity due to potential severity. Allergy testing needed for confirmation, but clinical history prioritized. - Continue to carry epipen  - Provide written food allergy action plan. - Strict peanut avoidance. - Follow up for allergy testing   Seasonal allergies Symptoms well-managed with cetirizine and Flonase. No current symptoms reported. Plan updated allergy testing. - Continue cetirizine 10mg  daily  - Continue Flonase 2 spray per nostril daily - Schedule updated environmental allergy testing.  Eczema Chronic leg rash with lichenification indicates  long-standing eczema. Managed with moisturizers and topical steroids. - Prescribe Clobetasol 0.05% cream twice a day, avoiding face, armpits, and groin. - Advise regular moisturizer use. - Use hypoallergenic skin care products   Lactose intolerance Diarrhea after lactose ingestion suggests intolerance. Managed with lactose-free products and lactase supplements. Condition is dose-dependent. - Advise lactose avoidance as tolerated. - Recommend lactose-free dairy products. - Start lactaid supplements before lactose ingestion.   Asthma (resolved) No symptoms or inhaler use, suggesting outgrown asthma.  Follow up: for allergy testing (1-68) Hold cetirizine for 3 days prior   This note in its entirety was forwarded to the Provider who requested this  consultation.  Other:     Thank you for your kind referral. I appreciate the opportunity to take part in Bob [name]'s care. Please do not hesitate to contact me with questions.  Sincerely,  Thank you so much for letting me partake in your care today.  Don't hesitate to reach out if you have any additional concerns!  Ferol Luz, MD  [facility] [place], [place]

## 2023-10-19 NOTE — Patient Instructions (Signed)
 Peanut allergy Diarrhea post-peanut ingestion suggests food allergy or FPIES. Emphasized EpiPen necessity due to potential severity. Allergy testing needed for confirmation, but clinical history prioritized. - Continue to carry epipen  - Provide written food allergy action plan. - Strict peanut avoidance. - Follow up for allergy testing   Seasonal allergies Symptoms well-managed with cetirizine and Flonase. No current symptoms reported. Plan updated allergy testing. - Continue cetirizine 10mg  daily  - Continue Flonase 2 spray per nostril daily - Schedule updated environmental allergy testing.  Eczema Chronic leg rash with lichenification indicates long-standing eczema. Managed with moisturizers and topical steroids. - Prescribe Clobetasol 0.05% cream twice a day, avoiding face, armpits, and groin. - Advise regular moisturizer use. - Use hypoallergenic skin care products   Lactose intolerance Diarrhea after lactose ingestion suggests intolerance. Managed with lactose-free products and lactase supplements. Condition is dose-dependent. - Advise lactose avoidance as tolerated. - Recommend lactose-free dairy products. - Start lactaid supplements before lactose ingestion.   Asthma (resolved) No symptoms or inhaler use, suggesting outgrown asthma.  Follow up: for allergy testing (1-68) Hold cetirizine for 3 days prior

## 2023-10-25 ENCOUNTER — Ambulatory Visit: Payer: MEDICAID | Admitting: Internal Medicine

## 2023-10-27 ENCOUNTER — Other Ambulatory Visit (HOSPITAL_COMMUNITY): Payer: Self-pay

## 2023-10-28 ENCOUNTER — Other Ambulatory Visit (HOSPITAL_COMMUNITY): Payer: Self-pay

## 2023-11-07 ENCOUNTER — Ambulatory Visit (INDEPENDENT_AMBULATORY_CARE_PROVIDER_SITE_OTHER): Payer: MEDICAID | Admitting: Internal Medicine

## 2023-11-07 DIAGNOSIS — J3089 Other allergic rhinitis: Secondary | ICD-10-CM

## 2023-11-07 NOTE — Patient Instructions (Signed)
 Allergy test (11/07/23):   Peanut allergy Diarrhea post-peanut ingestion suggests food allergy or FPIES. Emphasized EpiPen  necessity due to potential severity. Allergy testing needed for confirmation, but clinical history prioritized. - Continue to carry epipen   - Provide written food allergy action plan. - Strict peanut avoidance.   Seasonal allergies Symptoms well-managed with cetirizine  and Flonase . No current symptoms reported. Plan updated allergy testing. - Continue cetirizine  10mg  daily  - Continue Flonase  2 spray per nostril daily  Eczema Chronic leg rash with lichenification indicates long-standing eczema. Managed with moisturizers and topical steroids. - Prescribe Clobetasol 0.05% cream twice a day, avoiding face, armpits, and groin. - Advise regular moisturizer use. - Use hypoallergenic skin care products   Lactose intolerance Diarrhea after lactose ingestion suggests intolerance. Managed with lactose-free products and lactase supplements. Condition is dose-dependent. - Advise lactose avoidance as tolerated. - Recommend lactose-free dairy products. - Continue  lactaid supplements before lactose ingestion.     Follow up:

## 2023-11-07 NOTE — Progress Notes (Signed)
 Patient took an antihistamine prior to appointment for skin testing.  He was rescheduled

## 2023-11-11 ENCOUNTER — Ambulatory Visit: Payer: MEDICAID | Admitting: Family

## 2023-11-11 NOTE — Patient Instructions (Addendum)
 Peanut allergy Diarrhea post-peanut ingestion suggests food allergy or FPIES. Emphasized EpiPen  necessity due to potential severity.  -skin testing today is negative to peanut and common foods -We will get lab work to complement your skin testing today.  We will call you with results once they are all back. - Continue to carry epipen   - Follow emergency action plan - Strict peanut avoidance.   Seasonal allergies Symptoms well-managed with cetirizine  and Flonase . No current symptoms reported. Plan updated allergy testing. -skin testing today is positive to grass pollen, weed pollen, tree pollen, and cockroach with adequate controls.  Intradermal skin testing is positive to [place], [place], [place], ragweed mix, major mold mix 2, major mold mix 3, major mold mix 4, dust mite mix, cat hair, and dog - Continue cetirizine  10mg  daily  - Continue Flonase  2 spray per nostril daily  Eczema Chronic leg rash with lichenification indicates long-standing eczema. Managed with moisturizers and topical steroids. - Continue Clobetasol 0.05% cream twice a day, avoiding face, armpits, and groin. - Advise regular moisturizer use. - Use hypoallergenic skin care products   Lactose intolerance Diarrhea after lactose ingestion suggests intolerance. Managed with lactose-free products and lactase supplements. Condition is dose-dependent. - Advise lactose avoidance as tolerated. - Recommend lactose-free dairy products. - Continue  lactaid supplements before lactose ingestion.   Follow up:  6 weeks or sooner if needed  Reducing Pollen Exposure The American Academy of Allergy, Asthma and Immunology suggests the following steps to reduce your exposure to pollen during allergy seasons. Do not hang sheets or clothing out to dry; pollen may collect on these items. Do not mow lawns or spend time around freshly cut grass; mowing stirs up pollen. Keep windows closed at night.  Keep car windows closed while  driving. Minimize morning activities outdoors, a time when pollen counts are usually at their highest. Stay indoors as much as possible when pollen counts or humidity is high and on windy days when pollen tends to remain in the air longer. Use air conditioning when possible.  Many air conditioners have filters that trap the pollen spores. Use a HEPA room air filter to remove pollen form the indoor air you breathe.  Control of Cockroach Allergen  Cockroach allergen has been identified as an important cause of acute attacks of asthma, especially in urban settings.  There are fifty-five species of cockroach that exist in the [place] , however only three, the [place], [place] and [place] species produce allergen that can affect patients with Asthma.  Allergens can be obtained from fecal particles, egg casings and secretions from cockroaches.    Remove food sources. Reduce access to water. Seal access and entry points. Spray runways with 0.5-1% Diazinon or Chlorpyrifos Blow boric acid power under stoves and refrigerator. Place bait stations (hydramethylnon) at feeding sites.  Control of Dog or Cat Allergen Avoidance is the best way to manage a dog or cat allergy. If you have a dog or cat and are allergic to dog or cats, consider removing the dog or cat from the home. If you have a dog or cat but don't want to find it a new home, or if your family wants a pet even though someone in the household is allergic, here are some strategies that may help keep symptoms at bay:  Keep the pet out of your bedroom and restrict it to only a few rooms. Be advised that keeping the dog or cat in only one room will not limit the allergens to that room.  Don't pet, hug or kiss the dog or cat; if you do, wash your hands with soap and water. High-efficiency particulate air (HEPA) cleaners run continuously in a bedroom or living room can reduce allergen levels over time. Regular use of a high-efficiency vacuum  cleaner or a central vacuum can reduce allergen levels. Giving your dog or cat a bath at least once a week can reduce airborne allergen.  Control of Dust Mite Allergen Dust mites play a major role in allergic asthma and rhinitis. They occur in environments with high humidity wherever human skin is found. Dust mites absorb humidity from the atmosphere (ie, they do not drink) and feed on organic matter (including shed human and animal skin). Dust mites are a microscopic type of insect that you cannot see with the naked eye. High levels of dust mites have been detected from mattresses, pillows, carpets, upholstered furniture, bed covers, clothes, soft toys and any woven material. The principal allergen of the dust mite is found in its feces. A gram of dust may contain 1,000 mites and 250,000 fecal particles. Mite antigen is easily measured in the air during house cleaning activities. Dust mites do not bite and do not cause harm to humans, other than by triggering allergies/asthma.  Ways to decrease your exposure to dust mites in your home:  1. Encase mattresses, box springs and pillows with a mite-impermeable barrier or cover  2. Wash sheets, blankets and drapes weekly in hot water (130 F) with detergent and dry them in a dryer on the hot setting.  3. Have the room cleaned frequently with a vacuum cleaner and a damp dust-mop. For carpeting or rugs, vacuuming with a vacuum cleaner equipped with a high-efficiency particulate air (HEPA) filter. The dust mite allergic individual should not be in a room which is being cleaned and should wait 1 hour after cleaning before going into the room.  4. Do not sleep on upholstered furniture (eg, couches).  5. If possible removing carpeting, upholstered furniture and drapery from the home is ideal. Horizontal blinds should be eliminated in the rooms where the person spends the most time (bedroom, study, television room). Washable vinyl, roller-type shades are  optimal.  6. Remove all non-washable stuffed toys from the bedroom. Wash stuffed toys weekly like sheets and blankets above.  7. Reduce indoor humidity to less than 50%. Inexpensive humidity monitors can be purchased at most hardware stores. Do not use a humidifier as can make the problem worse and are not recommended.  Control of Mold Allergen Mold and fungi can grow on a variety of surfaces provided certain temperature and moisture conditions exist.  Outdoor molds grow on plants, decaying vegetation and soil.  The major outdoor mold, Alternaria and Cladosporium, are found in very high numbers during hot and dry conditions.  Generally, a late Summer - Fall peak is seen for common outdoor fungal spores.  Rain will temporarily lower outdoor mold spore count, but counts rise rapidly when the rainy period ends.  The most important indoor molds are Aspergillus and Penicillium.  Dark, humid and poorly ventilated basements are ideal sites for mold growth.  The next most common sites of mold growth are the bathroom and the kitchen.  Outdoor [employer] Use air conditioning and keep windows closed Avoid exposure to decaying vegetation. Avoid leaf raking. Avoid grain handling. Consider wearing a face mask if working in moldy areas.  Indoor Mold Control Maintain humidity below 50%. Clean washable surfaces with 5% bleach solution. Remove sources e.g.  Contaminated carpets.

## 2023-11-14 ENCOUNTER — Ambulatory Visit (INDEPENDENT_AMBULATORY_CARE_PROVIDER_SITE_OTHER): Payer: MEDICAID | Admitting: Family

## 2023-11-14 ENCOUNTER — Encounter: Payer: Self-pay | Admitting: Family

## 2023-11-14 DIAGNOSIS — T7819XA Other adverse food reactions, not elsewhere classified, initial encounter: Secondary | ICD-10-CM

## 2023-11-14 DIAGNOSIS — T781XXD Other adverse food reactions, not elsewhere classified, subsequent encounter: Secondary | ICD-10-CM | POA: Diagnosis not present

## 2023-11-14 DIAGNOSIS — J3089 Other allergic rhinitis: Secondary | ICD-10-CM

## 2023-11-14 DIAGNOSIS — T781XXA Other adverse food reactions, not elsewhere classified, initial encounter: Secondary | ICD-10-CM

## 2023-11-14 DIAGNOSIS — J302 Other seasonal allergic rhinitis: Secondary | ICD-10-CM

## 2023-11-14 NOTE — Progress Notes (Signed)
 Date of Service/Encounter:  11/14/23  Allergy testing appointment   Initial visit on 11/07/23, seen for peanut allergy, seasonal allergies, eczema, lactose intolerance.  Please see that note for additional details.  Today reports for allergy diagnostic testing:    DIAGNOSTICS:  Skin Testing: Environmental allergy panel and select foods. Adequate positive and negative controls Results discussed with patient/family.  Airborne Adult Perc - 11/14/23 0919     Time Antigen Placed 0919    Allergen Manufacturer Floyd Hutchinson    Location Back    Number of Test 55    1. Control-Buffer 50% Glycerol Negative    2. Control-Histamine 3+    3. [place] Negative    4. [place] Negative    5. Johnson Negative    6. [place]  Blue 3+    7. Meadow Fescue 3+    8. Perennial Rye 3+    9. Timothy 3+    10. Ragweed Mix Negative    11. Cocklebur Negative    12. Plantain,  English Negative    13. Baccharis Negative    14. Dog Fennel Negative    15. Russian Thistle Negative    16. Lamb's Quarters Negative    17. Sheep Sorrell Negative    18. Rough Pigweed Negative    19. Marsh Elder, Rough 2+    20. Mugwort, Common Negative    21. Box, Elder Negative    22. Cedar, red 2+    23. Sweet Gum Negative    24. Pecan Pollen Negative    25. Pine Mix Negative    26. Walnut, Black Pollen Negative    27. Red Mulberry Negative    28. Ash Mix Negative    29. Birch Mix Negative    30. Beech American Negative    31. Cottonwood, [place] Negative    32. Hickory, White Negative    33. Maple Mix 2+    34. Oak, [place] Mix 2+    35. Sycamore Eastern Negative    36. Alternaria Alternata Negative    37. Cladosporium Herbarum Negative    38. Aspergillus Mix Negative    39. Penicillium Mix Negative    40. Bipolaris Sorokiniana (Helminthosporium) Negative    41. Drechslera Spicifera (Curvularia) Negative    42. Mucor Plumbeus Negative    43. Fusarium Moniliforme Negative    44. Aureobasidium Pullulans (pullulara)  Negative    45. Rhizopus Oryzae Negative    46. Botrytis Cinera Negative    47. Epicoccum Nigrum Negative    48. Phoma Betae Negative    49. Dust Mite Mix Negative    50. Cat Hair 10,000 BAU/ml Negative    51.  Dog Epithelia Negative    52. Mixed Feathers Negative    53. Horse Epithelia Negative    54. Cockroach, German 2+    55. Tobacco Leaf Negative             13 Food Perc - 11/14/23 0919       Test Information   Time Antigen Placed 0920    Allergen Manufacturer Floyd Hutchinson    Location Back    Number of allergen test 13             Intradermal - 11/14/23 0954     Time Antigen Placed 0930    Location Arm    Number of Test 12    Control 3+    B[place]ia 4+    [place] 3+    Ragweed Mix 3+    Mold 1 Negative  Mold 2 2+    Mold 3 2+    Mold 4 2+    Mite Mix 4+    Cat 4+    Dog 3+    Cockroach 2+             Airborne Adult Perc - 11/14/23 0919     Time Antigen Placed 0919    Allergen Manufacturer Floyd Hutchinson    Location Back    Number of Test 55    1. Control-Buffer 50% Glycerol Negative    2. Control-Histamine 3+    3. Bahia Negative    4. [place] Negative    5. Johnson Negative    6. [place]  Blue 3+    7. Meadow Fescue 3+    8. Perennial Rye 3+    9. Timothy 3+    10. Ragweed Mix Negative    11. Cocklebur Negative    12. Plantain,  English Negative    13. Baccharis Negative    14. Dog Fennel Negative    15. Russian Thistle Negative    16. Lamb's Quarters Negative    17. Sheep Sorrell Negative    18. Rough Pigweed Negative    19. Marsh Elder, Rough 2+    20. Mugwort, Common Negative    21. Box, Elder Negative    22. Cedar, red 2+    23. Sweet Gum Negative    24. Pecan Pollen Negative    25. Pine Mix Negative    26. Walnut, Black Pollen Negative    27. Red Mulberry Negative    28. Ash Mix Negative    29. Birch Mix Negative    30. Beech American Negative    31. Cottonwood, [place] Negative    32. Hickory, White Negative    33. Maple Mix 2+     34. Oak, [place] Mix 2+    35. Sycamore Eastern Negative    36. Alternaria Alternata Negative    37. Cladosporium Herbarum Negative    38. Aspergillus Mix Negative    39. Penicillium Mix Negative    40. Bipolaris Sorokiniana (Helminthosporium) Negative    41. Drechslera Spicifera (Curvularia) Negative    42. Mucor Plumbeus Negative    43. Fusarium Moniliforme Negative    44. Aureobasidium Pullulans (pullulara) Negative    45. Rhizopus Oryzae Negative    46. Botrytis Cinera Negative    47. Epicoccum Nigrum Negative    48. Phoma Betae Negative    49. Dust Mite Mix Negative    50. Cat Hair 10,000 BAU/ml Negative    51.  Dog Epithelia Negative    52. Mixed Feathers Negative    53. Horse Epithelia Negative    54. Cockroach, German 2+    55. Tobacco Leaf Negative             13 Food Perc - 11/14/23 0919       Test Information   Time Antigen Placed 0920    Allergen Manufacturer Floyd Hutchinson    Location Back    Number of allergen test 13             Intradermal - 11/14/23 0954     Time Antigen Placed 0930    Location Arm    Number of Test 12    Control 3+    Bahia 4+    [place] 3+    Ragweed Mix 3+    Mold 1 Negative    Mold 2 2+    Mold 3 2+  Mold 4 2+    Mite Mix 4+    Cat 4+    Dog 3+    Cockroach 2+              Allergy testing results were read and interpreted by myself, documented by clinical staff.  Patient provided with copy of allergy testing along with avoidance measures when indicated.   Peanut allergy Diarrhea post-peanut ingestion suggests food allergy or FPIES. Emphasized EpiPen  necessity due to potential severity.  -skin testing today is negative to peanut and common foods -We will get lab work to complement your skin testing today.  We will call you with results once they are all back. - Continue to carry epipen   - Follow emergency action plan - Strict peanut avoidance.   Seasonal allergies Symptoms well-managed with cetirizine   and Flonase . No current symptoms reported. Plan updated allergy testing. -skin testing today is positive to grass pollen, weed pollen, tree pollen, and cockroach with adequate controls.  Intradermal skin testing is positive to [place], [place], [place], ragweed mix, major mold mix 2, major mold mix 3, major mold mix 4, dust mite mix, cat hair, and dog - Continue cetirizine  10mg  daily  - Continue Flonase  2 spray per nostril daily  Eczema Chronic leg rash with lichenification indicates long-standing eczema. Managed with moisturizers and topical steroids. - Continue Clobetasol 0.05% cream twice a day, avoiding face, armpits, and groin. - Advise regular moisturizer use. - Use hypoallergenic skin care products   Lactose intolerance Diarrhea after lactose ingestion suggests intolerance. Managed with lactose-free products and lactase supplements. Condition is dose-dependent. - Advise lactose avoidance as tolerated. - Recommend lactose-free dairy products. - Continue  lactaid supplements before lactose ingestion.   Follow up: 6 weeks or sooner if needed  Reducing Pollen Exposure The American Academy of Allergy, Asthma and Immunology suggests the following steps to reduce your exposure to pollen during allergy seasons. Do not hang sheets or clothing out to dry; pollen may collect on these items. Do not mow lawns or spend time around freshly cut grass; mowing stirs up pollen. Keep windows closed at night.  Keep car windows closed while driving. Minimize morning activities outdoors, a time when pollen counts are usually at their highest. Stay indoors as much as possible when pollen counts or humidity is high and on windy days when pollen tends to remain in the air longer. Use air conditioning when possible.  Many air conditioners have filters that trap the pollen spores. Use a HEPA room air filter to remove pollen form the indoor air you breathe.  Control of Cockroach Allergen  Cockroach allergen  has been identified as an important cause of acute attacks of asthma, especially in urban settings.  There are fifty-five species of cockroach that exist in the [place] , however only three, the [place], [place] and [place] species produce allergen that can affect patients with Asthma.  Allergens can be obtained from fecal particles, egg casings and secretions from cockroaches.    Remove food sources. Reduce access to water. Seal access and entry points. Spray runways with 0.5-1% Diazinon or Chlorpyrifos Blow boric acid power under stoves and refrigerator. Place bait stations (hydramethylnon) at feeding sites.  Control of Dog or Cat Allergen Avoidance is the best way to manage a dog or cat allergy. If you have a dog or cat and are allergic to dog or cats, consider removing the dog or cat from the home. If you have a dog or cat but don't want to find  it a new home, or if your family wants a pet even though someone in the household is allergic, here are some strategies that may help keep symptoms at bay:  Keep the pet out of your bedroom and restrict it to only a few rooms. Be advised that keeping the dog or cat in only one room will not limit the allergens to that room. Don't pet, hug or kiss the dog or cat; if you do, wash your hands with soap and water. High-efficiency particulate air (HEPA) cleaners run continuously in a bedroom or living room can reduce allergen levels over time. Regular use of a high-efficiency vacuum cleaner or a central vacuum can reduce allergen levels. Giving your dog or cat a bath at least once a week can reduce airborne allergen.  Control of Dust Mite Allergen Dust mites play a major role in allergic asthma and rhinitis. They occur in environments with high humidity wherever human skin is found. Dust mites absorb humidity from the atmosphere (ie, they do not drink) and feed on organic matter (including shed human and animal skin). Dust mites are a microscopic type  of insect that you cannot see with the naked eye. High levels of dust mites have been detected from mattresses, pillows, carpets, upholstered furniture, bed covers, clothes, soft toys and any woven material. The principal allergen of the dust mite is found in its feces. A gram of dust may contain 1,000 mites and 250,000 fecal particles. Mite antigen is easily measured in the air during house cleaning activities. Dust mites do not bite and do not cause harm to humans, other than by triggering allergies/asthma.  Ways to decrease your exposure to dust mites in your home:  1. Encase mattresses, box springs and pillows with a mite-impermeable barrier or cover  2. Wash sheets, blankets and drapes weekly in hot water (130 F) with detergent and dry them in a dryer on the hot setting.  3. Have the room cleaned frequently with a vacuum cleaner and a damp dust-mop. For carpeting or rugs, vacuuming with a vacuum cleaner equipped with a high-efficiency particulate air (HEPA) filter. The dust mite allergic individual should not be in a room which is being cleaned and should wait 1 hour after cleaning before going into the room.  4. Do not sleep on upholstered furniture (eg, couches).  5. If possible removing carpeting, upholstered furniture and drapery from the home is ideal. Horizontal blinds should be eliminated in the rooms where the person spends the most time (bedroom, study, television room). Washable vinyl, roller-type shades are optimal.  6. Remove all non-washable stuffed toys from the bedroom. Wash stuffed toys weekly like sheets and blankets above.  7. Reduce indoor humidity to less than 50%. Inexpensive humidity monitors can be purchased at most hardware stores. Do not use a humidifier as can make the problem worse and are not recommended.  Control of Mold Allergen Mold and fungi can grow on a variety of surfaces provided certain temperature and moisture conditions exist.  Outdoor molds grow on  plants, decaying vegetation and soil.  The major outdoor mold, Alternaria and Cladosporium, are found in very high numbers during hot and dry conditions.  Generally, a late Summer - Fall peak is seen for common outdoor fungal spores.  Rain will temporarily lower outdoor mold spore count, but counts rise rapidly when the rainy period ends.  The most important indoor molds are Aspergillus and Penicillium.  Dark, humid and poorly ventilated basements are ideal sites for mold  growth.  The next most common sites of mold growth are the bathroom and the kitchen.  Outdoor [employer] Use air conditioning and keep windows closed Avoid exposure to decaying vegetation. Avoid leaf raking. Avoid grain handling. Consider wearing a face mask if working in moldy areas.  Indoor Mold Control Maintain humidity below 50%. Clean washable surfaces with 5% bleach solution. Remove sources e.g. Contaminated carpets.  Tinnie Forehand, FNP [facility] 

## 2023-11-16 ENCOUNTER — Encounter: Payer: Self-pay | Admitting: Student

## 2023-11-17 ENCOUNTER — Other Ambulatory Visit: Payer: Self-pay | Admitting: Student

## 2023-11-17 ENCOUNTER — Other Ambulatory Visit (HOSPITAL_COMMUNITY): Payer: Self-pay

## 2023-11-17 ENCOUNTER — Other Ambulatory Visit: Payer: Self-pay | Admitting: Physician Assistant

## 2023-11-17 DIAGNOSIS — E66812 Obesity, class 2: Secondary | ICD-10-CM

## 2023-11-17 DIAGNOSIS — E6609 Other obesity due to excess calories: Secondary | ICD-10-CM

## 2023-11-17 LAB — IGE NUT PROF. W/COMPONENT RFLX
F017-IgE Hazelnut (Filbert): <0.1 kU/L
F018-IgE Brazil Nut: <0.1 kU/L
F020-IgE Almond: <0.1 kU/L
F202-IgE Cashew Nut: <0.1 kU/L
F203-IgE Pistachio Nut: <0.1 kU/L
F256-IgE Walnut: <0.1 kU/L
Macadamia Nut, IgE: <0.1 kU/L
Peanut, IgE: <0.1 kU/L
Pecan Nut IgE: <0.1 kU/L

## 2023-11-17 MED ORDER — HYDROCODONE-ACETAMINOPHEN 5-325 MG PO TABS
1.0000 | ORAL_TABLET | Freq: Three times a day (TID) | ORAL | 0 refills | Status: DC | PRN
  Filled 2023-11-17: qty 21, 7d supply, fill #0
  Filled 2023-11-28: qty 15, 5d supply, fill #0

## 2023-11-17 MED ORDER — ONDANSETRON HCL 4 MG PO TABS
4.0000 mg | ORAL_TABLET | Freq: Three times a day (TID) | ORAL | 0 refills | Status: DC | PRN
  Filled 2023-11-17: qty 40, 14d supply, fill #0

## 2023-11-17 MED ORDER — WEGOVY 0.25 MG/0.5ML ~~LOC~~ SOAJ
0.2500 mg | SUBCUTANEOUS | 1 refills | Status: DC
  Filled 2023-11-17: qty 2, 28d supply, fill #0

## 2023-11-17 NOTE — Progress Notes (Signed)
 Please let Bob [name] know that his lab work came back negative to peanuts and tree nuts also.  His skin testing to peanut was negative also at his last office visit.  If he is interested we could schedule an in office oral food challenge to peanut butter.  He would need to be off all antihistamines 3 days prior to this appointment and in good health (no vaccines or antibiotics 7 days prior to the appointment).  This appointment will last approximately 2 to 4 hours.  He will need to bring peanut butter with him the day of the challenge.  If he is not interested in scheduling an in office oral food challenge to peanut butter he needs to continue to avoid peanut products and have access to his epinephrine  autoinjector device at all times.

## 2023-11-17 NOTE — Progress Notes (Signed)
 Sent in prescription for Wegovy  will start at 0.25 mg weekly.

## 2023-11-18 ENCOUNTER — Telehealth: Payer: Self-pay

## 2023-11-18 DIAGNOSIS — E66812 Obesity, class 2: Secondary | ICD-10-CM

## 2023-11-18 NOTE — Telephone Encounter (Signed)
 Pharmacy Patient Advocate Encounter  Received notification from [employer] that Prior Authorization for WEGOVY  has been DENIED.  Full denial letter will be uploaded to the media tab. See denial reason below.    Patient will need an appt.

## 2023-11-18 NOTE — Telephone Encounter (Signed)
 Pharmacy Patient Advocate Encounter   Received notification from Physician's Office that prior authorization for WEGOVY  0.25/0.5MG  is required/requested.   Insurance verification completed.   The patient is insured through [facility] .   PA required; PA submitted to above mentioned insurance via CoverMyMeds Key/confirmation #/[id number]. Status is pending

## 2023-11-24 ENCOUNTER — Encounter (HOSPITAL_COMMUNITY): Payer: Self-pay | Admitting: Orthopaedic Surgery

## 2023-11-24 ENCOUNTER — Other Ambulatory Visit: Payer: Self-pay

## 2023-11-24 NOTE — Progress Notes (Signed)
 SDW CALL  Patient was given pre-op instructions over the phone. The opportunity was given for the patient to ask questions. No further questions asked. Patient verbalized understanding of instructions given.   PCP - Melvyn Stagers Cardiologist - denies Pulmonolpgist - Dr. Gaynell Keeler  PPM/ICD - denies Device Orders -  Rep Notified -   Chest x-ray - na EKG - DOS,MD order Stress Test - denies ECHO - denies Cardiac Cath - denies  Sleep Study - 10/14/18 CPAP - uses BiPAP  Fasting Blood Sugar - na Checks Blood Sugar _____ times a day  Blood Thinner Instructions:na Aspirin Instructions:na  ERAS Protcol -clears until 0430 PRE-SURGERY Ensure or G2-   COVID TEST- na   Anesthesia review: no  Patient denies shortness of breath, fever, cough and chest pain over the phone call    Special instructions:    Oral Hygiene is also important to reduce your risk of infection.  Remember - BRUSH YOUR TEETH THE MORNING OF SURGERY WITH YOUR REGULAR TOOTHPASTE

## 2023-11-27 NOTE — Anesthesia Preprocedure Evaluation (Signed)
 Anesthesia Evaluation  Patient identified by MRN, date of birth, ID band Patient awake    Reviewed: Allergy  & Precautions, NPO status , Patient's Chart, lab work & pertinent test results  History of Anesthesia Complications Negative for: history of anesthetic complications  Airway Mallampati: III  TM Distance: >3 FB Neck ROM: Full    Dental   Pulmonary neg shortness of breath, asthma (no recent flares) , sleep apnea and Continuous Positive Airway Pressure Ventilation , neg COPD, neg recent URI   Pulmonary exam normal breath sounds clear to auscultation       Cardiovascular negative cardio ROS  Rhythm:Regular Rate:Normal     Neuro/Psych neg Seizures Developmental delay  Neuromuscular disease    GI/Hepatic Neg liver ROS,GERD  ,,  Endo/Other    Class 4 obesityPre-diabetes  Renal/GU negative Renal ROS     Musculoskeletal   Abdominal  (+) + obese  Peds  Hematology negative hematology ROS (+) Lab Results      Component                Value               Date                      WBC                      5.4                 03/30/2023                HGB                      13.0                03/30/2023                HCT                      40.5                03/30/2023                MCV                      86.9                03/30/2023                PLT                      230                 03/30/2023              Anesthesia Other Findings Last Wegovy : not started  Reproductive/Obstetrics                             Anesthesia Physical Anesthesia Plan  ASA: 3  Anesthesia Plan: General   Post-op Pain Management: Minimal or no pain anticipated   Induction: Intravenous  PONV Risk Score and Plan: 2 and Ondansetron , Dexamethasone  and Treatment may vary due to age or medical condition  Airway Management Planned: LMA  Additional Equipment:   Intra-op Plan:   Post-operative  Plan: Extubation in OR  Informed Consent: I have reviewed  the patients History and Physical, chart, labs and discussed the procedure including the risks, benefits and alternatives for the proposed anesthesia with the patient or authorized representative who has indicated his/her understanding and acceptance.     Dental advisory given  Plan Discussed with: CRNA and Anesthesiologist  Anesthesia Plan Comments: (Risks of general anesthesia discussed including, but not limited to, sore throat, hoarse voice, chipped/damaged teeth, injury to vocal cords, nausea and vomiting, allergic reactions, lung infection, heart attack, stroke, and death. All questions answered. )       Anesthesia Quick Evaluation

## 2023-11-28 ENCOUNTER — Other Ambulatory Visit (HOSPITAL_COMMUNITY): Payer: Self-pay

## 2023-11-28 ENCOUNTER — Ambulatory Visit (HOSPITAL_COMMUNITY): Payer: MEDICAID

## 2023-11-28 ENCOUNTER — Other Ambulatory Visit: Payer: Self-pay

## 2023-11-28 ENCOUNTER — Ambulatory Visit (HOSPITAL_COMMUNITY)
Admission: RE | Admit: 2023-11-28 | Discharge: 2023-11-28 | Disposition: A | Discharge status: Home / Self Care | Payer: MEDICAID | Attending: Orthopaedic Surgery | Admitting: Orthopaedic Surgery

## 2023-11-28 ENCOUNTER — Encounter (HOSPITAL_COMMUNITY): Payer: Self-pay | Admitting: Orthopaedic Surgery

## 2023-11-28 ENCOUNTER — Encounter (HOSPITAL_COMMUNITY)
Admission: RE | Disposition: A | Discharge status: Home / Self Care | Payer: Self-pay | Source: Home / Self Care | Attending: Orthopaedic Surgery

## 2023-11-28 ENCOUNTER — Ambulatory Visit (HOSPITAL_BASED_OUTPATIENT_CLINIC_OR_DEPARTMENT_OTHER): Payer: MEDICAID

## 2023-11-28 DIAGNOSIS — J4520 Mild intermittent asthma, uncomplicated: Secondary | ICD-10-CM | POA: Diagnosis not present

## 2023-11-28 DIAGNOSIS — J45909 Unspecified asthma, uncomplicated: Secondary | ICD-10-CM | POA: Insufficient documentation

## 2023-11-28 DIAGNOSIS — K219 Gastro-esophageal reflux disease without esophagitis: Secondary | ICD-10-CM | POA: Diagnosis not present

## 2023-11-28 DIAGNOSIS — Z79899 Other long term (current) drug therapy: Secondary | ICD-10-CM | POA: Insufficient documentation

## 2023-11-28 DIAGNOSIS — G4733 Obstructive sleep apnea (adult) (pediatric): Secondary | ICD-10-CM

## 2023-11-28 DIAGNOSIS — E119 Type 2 diabetes mellitus without complications: Secondary | ICD-10-CM | POA: Diagnosis not present

## 2023-11-28 DIAGNOSIS — Z791 Long term (current) use of non-steroidal anti-inflammatories (NSAID): Secondary | ICD-10-CM | POA: Insufficient documentation

## 2023-11-28 DIAGNOSIS — G5602 Carpal tunnel syndrome, left upper limb: Secondary | ICD-10-CM

## 2023-11-28 DIAGNOSIS — G4730 Sleep apnea, unspecified: Secondary | ICD-10-CM | POA: Diagnosis not present

## 2023-11-28 DIAGNOSIS — Z6841 Body Mass Index (BMI) 40.0 and over, adult: Secondary | ICD-10-CM | POA: Diagnosis not present

## 2023-11-28 DIAGNOSIS — E6689 Other obesity not elsewhere classified: Secondary | ICD-10-CM | POA: Insufficient documentation

## 2023-11-28 HISTORY — PX: CARPAL TUNNEL RELEASE: SHX101

## 2023-11-28 LAB — CBC
HCT: 39.2 % (ref 39.0–52.0)
Hemoglobin: 12.8 g/dL — ABNORMAL LOW (ref 13.0–17.0)
MCH: 28.8 pg (ref 26.0–34.0)
MCHC: 32.7 g/dL (ref 30.0–36.0)
MCV: 88.3 fL (ref 80.0–100.0)
Platelets: 221 10*3/uL (ref 150–400)
RBC: 4.44 MIL/uL (ref 4.22–5.81)
RDW: 13.5 % (ref 11.5–15.5)
WBC: 5.6 10*3/uL (ref 4.0–10.5)
nRBC: 0 % (ref 0.0–0.2)

## 2023-11-28 SURGERY — CARPAL TUNNEL RELEASE
Anesthesia: General | Laterality: Left

## 2023-11-28 MED ORDER — MIDAZOLAM HCL 2 MG/2ML IJ SOLN
INTRAMUSCULAR | Status: DC | PRN
  Administered 2023-11-28: 2 mg via INTRAVENOUS

## 2023-11-28 MED ORDER — BUPIVACAINE-EPINEPHRINE (PF) 0.25% -1:200000 IJ SOLN
INTRAMUSCULAR | Status: AC
  Filled 2023-11-28: qty 30

## 2023-11-28 MED ORDER — MIDAZOLAM HCL 2 MG/2ML IJ SOLN
INTRAMUSCULAR | Status: AC
  Filled 2023-11-28: qty 2

## 2023-11-28 MED ORDER — LACTATED RINGERS IV SOLN: INTRAVENOUS | Status: DC

## 2023-11-28 MED ORDER — FENTANYL CITRATE (PF) 250 MCG/5ML IJ SOLN
INTRAMUSCULAR | Status: AC
  Filled 2023-11-28: qty 5

## 2023-11-28 MED ORDER — ONDANSETRON HCL 4 MG/2ML IJ SOLN
INTRAMUSCULAR | Status: DC | PRN
  Administered 2023-11-28: 4 mg via INTRAVENOUS

## 2023-11-28 MED ORDER — FENTANYL CITRATE (PF) 100 MCG/2ML IJ SOLN: 25.0000 ug | INTRAMUSCULAR | Status: DC | PRN

## 2023-11-28 MED ORDER — SODIUM CHLORIDE 0.9 % IR SOLN
Status: DC | PRN
  Administered 2023-11-28: 1000 mL

## 2023-11-28 MED ORDER — CEFAZOLIN SODIUM-DEXTROSE 3-4 GM/150ML-% IV SOLN
3.0000 g | INTRAVENOUS | Status: AC
  Administered 2023-11-28: 3 g via INTRAVENOUS
  Filled 2023-11-28: qty 150

## 2023-11-28 MED ORDER — CHLORHEXIDINE GLUCONATE 0.12 % MT SOLN
15.0000 mL | Freq: Once | OROMUCOSAL | Status: AC
  Administered 2023-11-28: 15 mL via OROMUCOSAL
  Filled 2023-11-28: qty 15

## 2023-11-28 MED ORDER — GLYCOPYRROLATE 0.2 MG/ML IJ SOLN
INTRAMUSCULAR | Status: DC | PRN
  Administered 2023-11-28: .1 mg via INTRAVENOUS

## 2023-11-28 MED ORDER — DEXAMETHASONE SODIUM PHOSPHATE 10 MG/ML IJ SOLN
INTRAMUSCULAR | Status: AC
  Filled 2023-11-28: qty 1

## 2023-11-28 MED ORDER — ONDANSETRON HCL 4 MG/2ML IJ SOLN
INTRAMUSCULAR | Status: AC
  Filled 2023-11-28: qty 2

## 2023-11-28 MED ORDER — PROPOFOL 10 MG/ML IV BOLUS
INTRAVENOUS | Status: AC
  Filled 2023-11-28: qty 20

## 2023-11-28 MED ORDER — ORAL CARE MOUTH RINSE: 15.0000 mL | Freq: Once | OROMUCOSAL | Status: AC

## 2023-11-28 MED ORDER — DEXAMETHASONE SODIUM PHOSPHATE 10 MG/ML IJ SOLN
INTRAMUSCULAR | Status: DC | PRN
  Administered 2023-11-28: 10 mg via INTRAVENOUS

## 2023-11-28 MED ORDER — OXYCODONE HCL 5 MG/5ML PO SOLN: 5.0000 mg | Freq: Once | ORAL | Status: DC | PRN

## 2023-11-28 MED ORDER — FENTANYL CITRATE (PF) 250 MCG/5ML IJ SOLN
INTRAMUSCULAR | Status: DC | PRN
  Administered 2023-11-28 (×3): 50 ug via INTRAVENOUS

## 2023-11-28 MED ORDER — LIDOCAINE 2% (20 MG/ML) 5 ML SYRINGE
INTRAMUSCULAR | Status: DC | PRN
  Administered 2023-11-28: 100 mg via INTRAVENOUS

## 2023-11-28 MED ORDER — DEXMEDETOMIDINE HCL IN NACL 80 MCG/20ML IV SOLN
INTRAVENOUS | Status: DC | PRN
  Administered 2023-11-28: 4 ug via INTRAVENOUS
  Administered 2023-11-28 (×2): 8 ug via INTRAVENOUS

## 2023-11-28 MED ORDER — AMISULPRIDE (ANTIEMETIC) 5 MG/2ML IV SOLN: 10.0000 mg | Freq: Once | INTRAVENOUS | Status: DC | PRN

## 2023-11-28 MED ORDER — PHENYLEPHRINE HCL (PRESSORS) 10 MG/ML IV SOLN
INTRAVENOUS | Status: DC | PRN
  Administered 2023-11-28 (×2): 80 ug via INTRAVENOUS

## 2023-11-28 MED ORDER — BUPIVACAINE-EPINEPHRINE (PF) 0.25% -1:200000 IJ SOLN
INTRAMUSCULAR | Status: DC | PRN
  Administered 2023-11-28: 10 mL

## 2023-11-28 MED ORDER — OXYCODONE HCL 5 MG PO TABS: 5.0000 mg | ORAL_TABLET | Freq: Once | ORAL | Status: DC | PRN

## 2023-11-28 MED ORDER — PROPOFOL 10 MG/ML IV BOLUS
INTRAVENOUS | Status: DC | PRN
  Administered 2023-11-28: 50 mg via INTRAVENOUS
  Administered 2023-11-28: 30 mg via INTRAVENOUS
  Administered 2023-11-28: 300 mg via INTRAVENOUS
  Administered 2023-11-28: 30 mg via INTRAVENOUS

## 2023-11-28 SURGICAL SUPPLY — 39 items
BAG COUNTER SPONGE SURGICOUNT (BAG) ×2 IMPLANT
BAND RUBBER #18 3X1/16 STRL (MISCELLANEOUS) IMPLANT
BNDG ELASTIC 3INX 5YD STR LF (GAUZE/BANDAGES/DRESSINGS) IMPLANT
BNDG ESMARK 4X9 LF (GAUZE/BANDAGES/DRESSINGS) ×2 IMPLANT
BNDG STRETCH GAUZE 3IN X12FT (GAUZE/BANDAGES/DRESSINGS) IMPLANT
CORD BIPOLAR FORCEPS 12FT (ELECTRODE) IMPLANT
COVER SURGICAL LIGHT HANDLE (MISCELLANEOUS) ×2 IMPLANT
CUFF TOURN SGL QUICK 18X4 (TOURNIQUET CUFF) IMPLANT
CUFF TOURN SGL QUICK 34 NS (TOURNIQUET CUFF) IMPLANT
DRAPE SURG 17X23 STRL (DRAPES) IMPLANT
DRSG XEROFORM 1X8 (GAUZE/BANDAGES/DRESSINGS) IMPLANT
DURAPREP 26ML APPLICATOR (WOUND CARE) ×2 IMPLANT
ELECTRODE REM PT RTRN 9FT ADLT (ELECTROSURGICAL) IMPLANT
GAUZE SPONGE 4X4 12PLY STRL (GAUZE/BANDAGES/DRESSINGS) ×2 IMPLANT
GAUZE XEROFORM 1X8 LF (GAUZE/BANDAGES/DRESSINGS) ×2 IMPLANT
GLOVE BIOGEL PI IND STRL 7.0 (GLOVE) ×6 IMPLANT
GLOVE BIOGEL PI IND STRL 7.5 (GLOVE) ×2 IMPLANT
GLOVE ECLIPSE 7.0 STRL STRAW (GLOVE) ×4 IMPLANT
GLOVE SKINSENSE STRL SZ7.5 (GLOVE) ×4 IMPLANT
GLOVE SURG SYN 7.5 E (GLOVE) ×2 IMPLANT
GLOVE SURG SYN 7.5 PF PI (GLOVE) ×4 IMPLANT
GLOVE SURG UNDER POLY LF SZ7 (GLOVE) ×44 IMPLANT
GLOVE SURG UNDER POLY LF SZ7.5 (GLOVE) ×8 IMPLANT
GOWN STRL REUS W/ TWL LRG LVL3 (GOWN DISPOSABLE) ×2 IMPLANT
GOWN STRL SURGICAL XL XLNG (GOWN DISPOSABLE) ×4 IMPLANT
KIT BASIN OR (CUSTOM PROCEDURE TRAY) ×2 IMPLANT
KIT TURNOVER KIT B (KITS) ×2 IMPLANT
NS IRRIG 1000ML POUR BTL (IV SOLUTION) ×4 IMPLANT
PACK ORTHO EXTREMITY (CUSTOM PROCEDURE TRAY) ×2 IMPLANT
PAD ARMBOARD POSITIONER FOAM (MISCELLANEOUS) ×2 IMPLANT
PADDING CAST ABS COTTON 3X4 (CAST SUPPLIES) IMPLANT
PADDING CAST ABS COTTON 4X4 ST (CAST SUPPLIES) ×4 IMPLANT
SUT ETHILON 3 0 PS 1 (SUTURE) IMPLANT
SUT ETHILON 4 0 PS 2 18 (SUTURE) ×2 IMPLANT
SYR CONTROL 10ML LL (SYRINGE) IMPLANT
TOWEL GREEN STERILE (TOWEL DISPOSABLE) ×2 IMPLANT
TOWEL GREEN STERILE FF (TOWEL DISPOSABLE) ×2 IMPLANT
UNDERPAD 30X36 HEAVY ABSORB (UNDERPADS AND DIAPERS) ×4 IMPLANT
WATER STERILE IRR 1000ML POUR (IV SOLUTION) ×2 IMPLANT

## 2023-11-28 NOTE — Transfer of Care (Signed)
 Immediate Anesthesia Transfer of Care Note  Patient: Bob [name]  Procedure(s) Performed: CARPAL TUNNEL RELEASE (Left)  Patient Location: PACU  Anesthesia Type:General  Level of Consciousness: drowsy  Airway & Oxygen Therapy: Patient Spontanous Breathing and Patient connected to face mask oxygen  Post-op Assessment: Report given to RN and Post -op Vital signs reviewed and stable  Post vital signs: Reviewed and stable  Last Vitals:  Vitals Value Taken Time  BP 125/79 11/28/23 0815  Temp 36.6 C 11/28/23 0808  Pulse 93 11/28/23 0820  Resp 25 11/28/23 0820  SpO2 98 % 11/28/23 0820  Vitals shown include unfiled device data.  Last Pain:  Vitals:   11/28/23 0808  TempSrc:   PainSc: Asleep         Complications: No notable events documented.

## 2023-11-28 NOTE — Discharge Instructions (Signed)
 Postoperative instructions:  Weightbearing instructions: don't lift more than 10 lbs for 4 weeks  Dressing instructions: Keep your dressing and/or splint clean and dry at all times.  It will be removed at your first post-operative appointment.  Your stitches and/or staples will be removed at this visit.  Incision instructions:  Do not soak your incision for 3 weeks after surgery.  If the incision gets wet, pat dry and do not scrub the incision.  Pain control:  You have been given a prescription to be taken as directed for post-operative pain control.  In addition, elevate the operative extremity above the heart at all times to prevent swelling and throbbing pain.  Take over-the-counter Colace, 100mg  by mouth twice a day while taking narcotic pain medications to help prevent constipation.  Follow up appointments: 1) 10 days for suture removal and wound check. 2) Dr. Christiane Cowing as scheduled.   -------------------------------------------------------------------------------------------------------------  After Surgery Pain Control:  After your surgery, post-surgical discomfort or pain is likely. This discomfort can last several days to a few weeks. At certain times of the day your discomfort may be more intense.  Did you receive a nerve block?  A nerve block can provide pain relief for one hour to two days after your surgery. As long as the nerve block is working, you will experience little or no sensation in the area the surgeon operated on.  As the nerve block wears off, you will begin to experience pain or discomfort. It is very important that you begin taking your prescribed pain medication before the nerve block fully wears off. Treating your pain at the first sign of the block wearing off will ensure your pain is better controlled and more tolerable when full-sensation returns. Do not wait until the pain is intolerable, as the medicine will be less effective. It is better to treat pain in  advance than to try and catch up.  General Anesthesia:  If you did not receive a nerve block during your surgery, you will need to start taking your pain medication shortly after your surgery and should continue to do so as prescribed by your surgeon.  Pain Medication:  Most commonly we prescribe Vicodin and Percocet for post-operative pain. Both of these medications contain a combination of acetaminophen  (Tylenol ) and a narcotic to help control pain.   It takes between 30 and 45 minutes before pain medication starts to work. It is important to take your medication before your pain level gets too intense.   Nausea is a common side effect of many pain medications. You will want to eat something before taking your pain medicine to help prevent nausea.   If you are taking a prescription pain medication that contains acetaminophen , we recommend that you do not take additional over the counter acetaminophen  (Tylenol ).  Other pain relieving options:   Using a cold pack to ice the affected area a few times a day (15 to 20 minutes at a time) can help to relieve pain, reduce swelling and bruising.   Elevation of the affected area can also help to reduce pain and swelling.  Per [facility] clinic policy, our goal is ensure optimal postoperative pain control with a multimodal pain management strategy. For all [facility] patients, our goal is to wean post-operative narcotic medications by 6 weeks post-operatively. If this is not possible due to utilization of pain medication prior to surgery, your [facility] doctor will support your acute post-operative pain control for the first 6 weeks postoperatively, with a plan  to transition you back to your primary pain team following that. Max Spain will work to ensure a Therapist, occupational.

## 2023-11-28 NOTE — Op Note (Signed)
   Carpal tunnel op note  DATE OF SURGERY:11/28/2023  PREOPERATIVE DIAGNOSIS:  Left carpal tunnel syndrome  POSTOPERATIVE DIAGNOSIS: same  PROCEDURE: Left carpal tunnel release. CPT 52841  SURGEON: Edison Gore, M.D.  ASSIST: Sharran Decent, [place]  ANESTHESIA:  Local and general LMA  TOURNIQUET TIME: less than 20 minutes  BLOOD LOSS: Minimal.  COMPLICATIONS: None.  PATHOLOGY: None.  INDICATIONS: The patient is a 23 y.o. -year-old male who presented with carpal tunnel syndrome failing nonsurgical management, indicated for surgical release.  DESCRIPTION OF PROCEDURE: The patient was identified in the preoperative holding area.  The operative site was marked by the surgeon and confirmed by the patient.  The patient was brought back to the operating room.  General anesthesia administered.  A well padded nonsterile tourniquet was placed. The operative extremity was prepped and draped in standard sterile fashion.  A timeout was performed.  Preoperative antibiotics were given.   A palmar incision was made about 5 mm ulnar to the thenar crease.  The palmar aponeurosis was exposed and divided in line with the skin incision. The palmaris brevis was visualized and divided.  The distal edge of the transcarpal ligament was identified. A hemostat was inserted into the carpal tunnel to protect the median nerve and the flexor tendons. Then, the transverse carpal ligament was released under direct visualization. Proximally, a subcutaneous tunnel was made allowing a Sewell retractor to be placed. Then, the distal portion of the antebrachial fascia was released. Distally, all fibrous bands were released. The median nerve was visualized, and the fat pad was exposed. 0.25% marcaine  with epi injected for local. Wound was irrigated and closed with 3-0 nylon sutures. Sterile dressing applied. The patient was transferred to the recovery room in stable condition after all counts were  correct.  POSTOPERATIVE PLAN: To start nerve gliding exercises as tolerated and no heavy lifting for four weeks.  Claria Crofts, M.D. [facility] 7:46 AM

## 2023-11-28 NOTE — H&P (Signed)
 PREOPERATIVE H&P  Chief Complaint: LEFT CARPAL TUNNEL SYNDROME  HPI: Bob [name] is a 23 y.o. male who presents for surgical treatment of LEFT CARPAL TUNNEL SYNDROME.  He denies any changes in medical history.  Past Surgical History:  Procedure Laterality Date  . ADENOIDECTOMY     23 years old  . CARPAL TUNNEL RELEASE Right 03/30/2023   Procedure: RIGHT CARPAL TUNNEL RELEASE;  Surgeon: Wes Hamman, MD;  Location: [facility] OR;  Service: Orthopedics;  Laterality: Right;  . TONSILECTOMY, ADENOIDECTOMY, BILATERAL MYRINGOTOMY AND TUBES    . TYMPANOSTOMY TUBE PLACEMENT  2004   Social History   Socioeconomic History  . Marital status: Single    Spouse name: Not on file  . Number of children: Not on file  . Years of education: Not on file  . Highest education level: Some college, no degree  Occupational History  . Not on file  Tobacco Use  . Smoking status: Never  . Smokeless tobacco: Never  Vaping Use  . Vaping status: Never Used  Substance and Sexual Activity  . Alcohol use: Never  . Drug use: Never  . Sexual activity: Never  Other Topics Concern  . Not on file  Social History Narrative   Lives with Mother and older brother Elvan Hamel. [employer] helps out.    Social Drivers of Corporate investment banker Strain: Not on file  Food Insecurity: Not on file  Transportation Needs: Not on file  Physical Activity: Not on file  Stress: Not on file  Social Connections: Not on file   Family History  Problem Relation Age of Onset  . Diabetes Mother   . Hyperlipidemia Mother   . Asthma Father   . Cancer Father   . Depression Father   . Diabetes Father   . Hyperlipidemia Father   . Hypertension Father   . Asthma Sister   . Food Allergy  Sister   . Food Allergy  Brother   . Asthma Brother   . Birth defects Brother    Allergies  Allergen Reactions  . [place] Other (See Comments)    Skin irritation  . [place] Grass Extract Other (See Comments)    Skin Irritation  . Black Walnut  Flavoring Agent (Non-Screening) Other (See Comments)    Asphyxiation  . Peanut-Containing Drug Products Diarrhea   Prior to Admission medications   Medication Sig Start Date End Date Taking? Authorizing Provider  cetirizine  (ZYRTEC ) 10 MG tablet Take 1 tablet (10 mg total) by mouth at bedtime as needed for allergies. 09/15/23  Yes Limmie Ren, MD  clotrimazole -betamethasone  (LOTRISONE ) cream Apply 1 Application topically 2 (two) times daily. 09/29/23  Yes McDonald, Olive Better, DPM  EPINEPHrine  0.3 mg/0.3 mL IJ SOAJ injection Inject 0.3 mg into the muscle as needed for anaphylaxis. 09/15/23  Yes Limmie Ren, MD  fluticasone  (FLONASE ) 50 MCG/ACT nasal spray Place 2 sprays into both nostrils daily. 09/15/23  Yes Limmie Ren, MD  ibuprofen  (ADVIL ) 200 MG tablet Take 200-800 mg by mouth every 6 (six) hours as needed for moderate pain (pain score 4-6).   Yes [provider]  modafinil  (PROVIGIL ) 100 MG tablet Take 1 tablet (100 mg total) by mouth daily. 10/07/23  Yes Parrett, Tammy S, NP  Multiple Vitamin (MULTIVITAMIN PO) Take 2 tablets by mouth daily.   Yes [provider]  diclofenac  Sodium (VOLTAREN ) 1 % GEL Apply 4 grams topically 4 (four) times daily. Patient not taking: Reported on 10/19/2023 01/10/23  HYDROcodone -acetaminophen  (NORCO/VICODIN) 5-325 MG tablet Take 1 tablet by mouth 3 (three) times daily as needed for moderate pain (pain score 4-6). To be taken after surgery Patient not taking: Reported on 11/24/2023 11/17/23   Sandie Cross, PA-C  Lactase 4500 units TABS Take 1 tab prior to eating lactose containing dairy Patient not taking: Reported on 11/24/2023 10/19/23   Orelia Binet, MD  ondansetron  (ZOFRAN ) 4 MG tablet Take 1 tablet (4 mg total) by mouth every 8 (eight) hours as needed for nausea or vomiting. Patient not taking: Reported on 11/24/2023 11/17/23   Sandie Cross, PA-C  Semaglutide -Weight Management (WEGOVY ) 0.25 MG/0.5ML SOAJ Inject 0.25 mg into the  skin once a week. Patient not taking: Reported on 11/24/2023 11/17/23   Goble Last, MD     Positive ROS: All other systems have been reviewed and were otherwise negative with the exception of those mentioned in the HPI and as above.  Physical Exam: General: Alert, no acute distress Cardiovascular: No pedal edema Respiratory: No cyanosis, no use of accessory musculature GI: abdomen soft Skin: No lesions in the area of chief complaint Neurologic: Sensation intact distally Psychiatric: Patient is competent for consent with normal mood and affect Lymphatic: no lymphedema  MUSCULOSKELETAL: exam stable  Assessment: LEFT CARPAL TUNNEL SYNDROME  Plan: Plan for Procedure(s): CARPAL TUNNEL RELEASE  The risks benefits and alternatives were discussed with the patient including but not limited to the risks of nonoperative treatment, versus surgical intervention including infection, bleeding, nerve injury,  blood clots, cardiopulmonary complications, morbidity, mortality, among others, and they were willing to proceed.   Claria Crofts, MD 11/28/2023 5:50 AM

## 2023-11-28 NOTE — Anesthesia Postprocedure Evaluation (Signed)
 Anesthesia Post Note  Patient: Bob [name]  Procedure(s) Performed: CARPAL TUNNEL RELEASE (Left)     Patient location during evaluation: PACU Anesthesia Type: General Level of consciousness: awake Pain management: pain level controlled Vital Signs Assessment: post-procedure vital signs reviewed and stable Respiratory status: spontaneous breathing, nonlabored ventilation and respiratory function stable Cardiovascular status: blood pressure returned to baseline and stable Postop Assessment: no apparent nausea or vomiting Anesthetic complications: no   No notable events documented.  Last Vitals:  Vitals:   11/28/23 0845 11/28/23 0850  BP: 124/87 120/88  Pulse: 91 88  Resp: 14 18  Temp:  36.6 C  SpO2: 97% 100%    Last Pain:  Vitals:   11/28/23 0850  TempSrc:   PainSc: 0-No pain                 Conard Decent

## 2023-11-28 NOTE — Anesthesia Procedure Notes (Signed)
 Procedure Name: LMA Insertion Date/Time: 11/28/2023 7:15 AM  Performed by: Camil Wilhelmsen, CRNAPre-anesthesia Checklist: Patient identified, Emergency Drugs available, Suction available and Patient being monitored Patient Re-evaluated:Patient Re-evaluated prior to induction Oxygen Delivery Method: Circle System Utilized Preoxygenation: Pre-oxygenation with 100% oxygen Induction Type: IV induction Ventilation: Mask ventilation without difficulty LMA: LMA inserted LMA Size: 5.0 Number of attempts: 1 Airway Equipment and Method: Bite block Placement Confirmation: positive ETCO2 Tube secured with: Tape Dental Injury: Teeth and Oropharynx as per pre-operative assessment  Comments: Teeth, lips and tongue at baseline.

## 2023-11-29 ENCOUNTER — Encounter (HOSPITAL_COMMUNITY): Payer: Self-pay | Admitting: Orthopaedic Surgery

## 2023-11-29 ENCOUNTER — Other Ambulatory Visit (HOSPITAL_COMMUNITY): Payer: Self-pay

## 2023-11-29 ENCOUNTER — Telehealth: Payer: Self-pay

## 2023-11-29 ENCOUNTER — Other Ambulatory Visit: Payer: Self-pay

## 2023-11-29 MED ORDER — WEGOVY 0.25 MG/0.5ML ~~LOC~~ SOAJ
0.2500 mg | SUBCUTANEOUS | 1 refills | Status: DC
Start: 2023-11-29 — End: 2024-02-03
  Filled 2023-11-29 – 2023-12-22 (×2): qty 2, 28d supply, fill #0
  Filled 2024-01-19: qty 2, 28d supply, fill #1

## 2023-11-29 NOTE — Telephone Encounter (Signed)
 Patient seen in hospital for surgery yesterday. Has newly documented BMI on chart of 62.65.  I have re-ordered Wegovy .  Alexa Andrews, MD

## 2023-11-29 NOTE — Addendum Note (Signed)
 Addended by: Darell Echevaria B on: 11/29/2023 09:19 AM   Modules accepted: Orders

## 2023-11-29 NOTE — Telephone Encounter (Signed)
 Pharmacy Patient Advocate Encounter   Received notification from CoverMyMeds that prior authorization for WEGOVY  0.25MG  is required/requested.   Insurance verification completed.   The patient is insured through [facility] .   PA required; PA submitted to above mentioned insurance via CoverMyMeds Key/confirmation #/EOC [id number]. Status is pending  Attached chart notes from 09/15/23 appt and a screenshot of current weight, last updated 10/19/23.

## 2023-11-29 NOTE — Telephone Encounter (Signed)
 Pharmacy Patient Advocate Encounter  Received notification from [employer] that Prior Authorization for WEGOVY  0.25MG  has been APPROVED from 11/29/23 to 05/31/24

## 2023-11-30 NOTE — Telephone Encounter (Signed)
 Spoke to patient he will call back once mom is home for transportation. He will bring Jif pb and aware of 2-4 hour procedure

## 2023-12-02 ENCOUNTER — Other Ambulatory Visit (HOSPITAL_COMMUNITY): Payer: Self-pay

## 2023-12-07 ENCOUNTER — Ambulatory Visit (INDEPENDENT_AMBULATORY_CARE_PROVIDER_SITE_OTHER): Payer: MEDICAID | Admitting: Physician Assistant

## 2023-12-07 DIAGNOSIS — Z9889 Other specified postprocedural states: Secondary | ICD-10-CM

## 2023-12-07 DIAGNOSIS — G5602 Carpal tunnel syndrome, left upper limb: Secondary | ICD-10-CM

## 2023-12-07 NOTE — Progress Notes (Signed)
 Post-Op Visit Note   Patient: Bob [name]           Date of Birth: 11/06/2000           MRN: [id number] Visit Date: 12/07/2023 PCP: Limmie Ren, MD   Assessment & Plan:  Chief Complaint:  Chief Complaint  Patient presents with   Left Wrist - Routine Post Op   Visit Diagnoses:  1. Left carpal tunnel syndrome   2. S/P carpal tunnel release     Plan: Patient is a very pleasant 23 year old who is here today with his mom.  He is about a week and a half out left carpal tunnel release.  He has been doing well.  No pain.  He does have some residual paresthesias primarily to the long and ring fingers.  Examination of the left hand reveals a well-healing surgical incision with nylon sutures in place.  No evidence of infection or cellulitis.  Fingers are warm and well-perfused.  He does have decreased sensation to the tip of the long and ring fingers.  Today, his wound was cleaned and recovered.  Velcro splint applied.  He will wear this at all times for the next week.  No heavy lifting or submerging his hand underwater until he is 4 weeks postop.  Follow-up next week for suture removal.  Call with concerns or questions.  Follow-Up Instructions: Return in about 1 week (around 12/14/2023).   Orders:  No orders of the defined types were placed in this encounter.  No orders of the defined types were placed in this encounter.   Imaging: No new imaging  PMFS History: Patient Active Problem List   Diagnosis Date Noted   De Quervain's tenosynovitis, left 07/29/2023   Right carpal tunnel syndrome 02/03/2023   Carpal tunnel syndrome on left 02/03/2023   Carpal tunnel syndrome 01/11/2023   Wrist pain 12/20/2022   Neuropathy 10/27/2021   Low back pain 09/02/2021   Contusion of left great toe without damage to nail 09/02/2021   Hyperkeratosis 05/16/2020   Daytime somnolence 02/29/2020   Mild intermittent asthma without complication 07/06/2017   Hypersomnia 07/28/2016   Acanthosis  nigricans 04/01/2016   Obstructive sleep apnea 08/27/2013   Seasonal allergies    Obesity 01/01/2013   Past Medical History:  Diagnosis Date   Acid reflux disease 2021   was on famotidine  but stopped after improvement   Allergy     Asthma 2007   Asthma exacerbation 11/17/2012   no current problems no inhaler   Development delay    Karyotype normal, fragile X normal   Early puberty, male 2009   at 7 years, familial   Impaired speech articulation 2011   Inadequate social skills 2011   Myopia 2010   Newborn exposure to maternal syphilis 2002   treated during preg   Obesity 2010   Pre-diabetes    Seasonal allergies 2007   Sleep apnea    uses CPAP nightly    Family History  Problem Relation Age of Onset   Diabetes Mother    Hyperlipidemia Mother    Asthma Father    Cancer Father    Depression Father    Diabetes Father    Hyperlipidemia Father    Hypertension Father    Asthma Sister    Food Allergy  Sister    Food Allergy  Brother    Asthma Brother    Birth defects Brother     Past Surgical History:  Procedure Laterality Date   ADENOIDECTOMY  23 years old   CARPAL TUNNEL RELEASE Right 03/30/2023   Procedure: RIGHT CARPAL TUNNEL RELEASE;  Surgeon: Wes Hamman, MD;  Location: [facility] OR;  Service: Orthopedics;  Laterality: Right;   CARPAL TUNNEL RELEASE Left 11/28/2023   Procedure: CARPAL TUNNEL RELEASE;  Surgeon: Wes Hamman, MD;  Location: [facility] OR;  Service: Orthopedics;  Laterality: Left;   TONSILECTOMY, ADENOIDECTOMY, BILATERAL MYRINGOTOMY AND TUBES     TYMPANOSTOMY TUBE PLACEMENT  2004   Social History   Occupational History   Not on file  Tobacco Use   Smoking status: Never   Smokeless tobacco: Never  Vaping Use   Vaping status: Never Used  Substance and Sexual Activity   Alcohol use: Never   Drug use: Never   Sexual activity: Never

## 2023-12-14 ENCOUNTER — Ambulatory Visit (INDEPENDENT_AMBULATORY_CARE_PROVIDER_SITE_OTHER): Payer: MEDICAID | Admitting: Physician Assistant

## 2023-12-14 DIAGNOSIS — Z9889 Other specified postprocedural states: Secondary | ICD-10-CM

## 2023-12-14 DIAGNOSIS — G5602 Carpal tunnel syndrome, left upper limb: Secondary | ICD-10-CM

## 2023-12-14 NOTE — Progress Notes (Signed)
 Post-Op Visit Note   Patient: Bob [name]           Date of Birth: 01-23-2001           MRN: [id number] Visit Date: 12/14/2023 PCP: Limmie Ren, MD   Assessment & Plan:  Chief Complaint:  Chief Complaint  Patient presents with   Left Wrist - Routine Post Op    11/28/2023 Left CTR   Visit Diagnoses:  1. S/P carpal tunnel release   2. Left carpal tunnel syndrome     Plan: Patient is a pleasant 23 year old who comes in today 2 weeks status post left carpal tunnel release 11/28/2023.  She has been doing well.  No pain.  He still has paresthesias throughout the median nerve distribution.  Examination of the left hand reveals a fully healed surgical incision with nylon sutures in place.  No evidence of infection or cellulitis.  Fingers are warm well-perfused.  He does have slight decrease sensation to the tip of the long and ring fingers.  Today, sutures were removed and Steri-Strips applied.  He will continue with nerve gliding exercises.  No heavy lifting or submerging his hand underwater for 2 weeks.  Follow-up in 2 weeks for recheck.  Call with concerns or questions.  Follow-Up Instructions: Return in about 2 weeks (around 12/28/2023).   Orders:  No orders of the defined types were placed in this encounter.  No orders of the defined types were placed in this encounter.   Imaging: No new imaging  PMFS History: Patient Active Problem List   Diagnosis Date Noted   De Quervain's tenosynovitis, left 07/29/2023   Right carpal tunnel syndrome 02/03/2023   Carpal tunnel syndrome on left 02/03/2023   Carpal tunnel syndrome 01/11/2023   Wrist pain 12/20/2022   Neuropathy 10/27/2021   Low back pain 09/02/2021   Contusion of left great toe without damage to nail 09/02/2021   Hyperkeratosis 05/16/2020   Daytime somnolence 02/29/2020   Mild intermittent asthma without complication 07/06/2017   Hypersomnia 07/28/2016   Acanthosis nigricans 04/01/2016   Obstructive sleep apnea  08/27/2013   Seasonal allergies    Obesity 01/01/2013   Past Medical History:  Diagnosis Date   Acid reflux disease 2021   was on famotidine  but stopped after improvement   Allergy     Asthma 2007   Asthma exacerbation 11/17/2012   no current problems no inhaler   Development delay    Karyotype normal, fragile X normal   Early puberty, male 2009   at 7 years, familial   Impaired speech articulation 2011   Inadequate social skills 2011   Myopia 2010   Newborn exposure to maternal syphilis 2002   treated during preg   Obesity 2010   Pre-diabetes    Seasonal allergies 2007   Sleep apnea    uses CPAP nightly    Family History  Problem Relation Age of Onset   Diabetes Mother    Hyperlipidemia Mother    Asthma Father    Cancer Father    Depression Father    Diabetes Father    Hyperlipidemia Father    Hypertension Father    Asthma Sister    Food Allergy  Sister    Food Allergy  Brother    Asthma Brother    Birth defects Brother     Past Surgical History:  Procedure Laterality Date   ADENOIDECTOMY     23 years old   CARPAL TUNNEL RELEASE Right 03/30/2023   Procedure: RIGHT CARPAL TUNNEL  RELEASE;  Surgeon: Wes Hamman, MD;  [facility];  Service: Orthopedics;  Laterality: Right;   CARPAL TUNNEL RELEASE Left 11/28/2023   Procedure: CARPAL TUNNEL RELEASE;  Surgeon: Wes Hamman, MD;  Location: [facility] OR;  Service: Orthopedics;  Laterality: Left;   TONSILECTOMY, ADENOIDECTOMY, BILATERAL MYRINGOTOMY AND TUBES     TYMPANOSTOMY TUBE PLACEMENT  2004   Social History   Occupational History   Not on file  Tobacco Use   Smoking status: Never   Smokeless tobacco: Never  Vaping Use   Vaping status: Never Used  Substance and Sexual Activity   Alcohol use: Never   Drug use: Never   Sexual activity: Never

## 2023-12-22 ENCOUNTER — Other Ambulatory Visit (HOSPITAL_COMMUNITY): Payer: Self-pay

## 2023-12-26 ENCOUNTER — Other Ambulatory Visit (HOSPITAL_COMMUNITY): Payer: Self-pay

## 2023-12-27 ENCOUNTER — Ambulatory Visit (INDEPENDENT_AMBULATORY_CARE_PROVIDER_SITE_OTHER): Payer: MEDICAID | Admitting: Physician Assistant

## 2023-12-27 ENCOUNTER — Encounter: Payer: Self-pay | Admitting: *Deleted

## 2023-12-27 DIAGNOSIS — Z9889 Other specified postprocedural states: Secondary | ICD-10-CM

## 2023-12-27 DIAGNOSIS — G5602 Carpal tunnel syndrome, left upper limb: Secondary | ICD-10-CM

## 2023-12-27 NOTE — Progress Notes (Signed)
 Post-Op Visit Note   Patient: Bob [name]           Date of Birth: 08-20-2000           MRN: [id number] Visit Date: 12/27/2023 PCP: Limmie Ren, MD   Assessment & Plan:  Chief Complaint:  Chief Complaint  Patient presents with   Left Hand - Follow-up   Visit Diagnoses:  1. Carpal tunnel syndrome on left   2. S/P carpal tunnel release     Plan: Patient is a pleasant 23 year old gentleman who comes in today 4 weeks status post left carpal tunnel release 11/28/2023.  He has been doing well now.  No pain.  He still has some paresthesias throughout the median nerve distribution although these have improved.  Examination of left hand reveals a fully healed surgical scar.  He does have some dry tissue that is sloughing off.  Decreased sensation to the long and ring fingertips.  Fingers warm well-perfused.  At this point, he has no restrictions.  Continue with nerve gliding exercises.  Increase activity as tolerated.  Follow-Up Instructions: Return if symptoms worsen or fail to improve.   Orders:  No orders of the defined types were placed in this encounter.  No orders of the defined types were placed in this encounter.   Imaging: No new imaging  PMFS History: Patient Active Problem List   Diagnosis Date Noted   De Quervain's tenosynovitis, left 07/29/2023   Right carpal tunnel syndrome 02/03/2023   Carpal tunnel syndrome on left 02/03/2023   Carpal tunnel syndrome 01/11/2023   Wrist pain 12/20/2022   Neuropathy 10/27/2021   Low back pain 09/02/2021   Contusion of left great toe without damage to nail 09/02/2021   Hyperkeratosis 05/16/2020   Daytime somnolence 02/29/2020   Mild intermittent asthma without complication 07/06/2017   Hypersomnia 07/28/2016   Acanthosis nigricans 04/01/2016   Obstructive sleep apnea 08/27/2013   Seasonal allergies    Obesity 01/01/2013   Past Medical History:  Diagnosis Date   Acid reflux disease 2021   was on famotidine  but  stopped after improvement   Allergy     Asthma 2007   Asthma exacerbation 11/17/2012   no current problems no inhaler   Development delay    Karyotype normal, fragile X normal   Early puberty, male 2009   at 7 years, familial   Impaired speech articulation 2011   Inadequate social skills 2011   Myopia 2010   Newborn exposure to maternal syphilis 2002   treated during preg   Obesity 2010   Pre-diabetes    Seasonal allergies 2007   Sleep apnea    uses CPAP nightly    Family History  Problem Relation Age of Onset   Diabetes Mother    Hyperlipidemia Mother    Asthma Father    Cancer Father    Depression Father    Diabetes Father    Hyperlipidemia Father    Hypertension Father    Asthma Sister    Food Allergy  Sister    Food Allergy  Brother    Asthma Brother    Birth defects Brother     Past Surgical History:  Procedure Laterality Date   ADENOIDECTOMY     23 years old   CARPAL TUNNEL RELEASE Right 03/30/2023   Procedure: RIGHT CARPAL TUNNEL RELEASE;  Surgeon: Wes Hamman, MD;  Location: [facility] OR;  Service: Orthopedics;  Laterality: Right;   CARPAL TUNNEL RELEASE Left 11/28/2023   Procedure: CARPAL TUNNEL RELEASE;  Surgeon: Wes Hamman, MD;  Location: [facility];  Service: Orthopedics;  Laterality: Left;   TONSILECTOMY, ADENOIDECTOMY, BILATERAL MYRINGOTOMY AND TUBES     TYMPANOSTOMY TUBE PLACEMENT  2004   Social History   Occupational History   Not on file  Tobacco Use   Smoking status: Never   Smokeless tobacco: Never  Vaping Use   Vaping status: Never Used  Substance and Sexual Activity   Alcohol use: Never   Drug use: Never   Sexual activity: Never

## 2023-12-28 ENCOUNTER — Ambulatory Visit (INDEPENDENT_AMBULATORY_CARE_PROVIDER_SITE_OTHER): Payer: MEDICAID | Admitting: Pulmonary Disease

## 2023-12-28 ENCOUNTER — Encounter: Payer: Self-pay | Admitting: Pulmonary Disease

## 2023-12-28 VITALS — BP 115/78 | HR 87 | Ht 68.0 in | Wt 393.6 lb

## 2023-12-28 DIAGNOSIS — E66813 Obesity, class 3: Secondary | ICD-10-CM | POA: Diagnosis not present

## 2023-12-28 DIAGNOSIS — G4713 Recurrent hypersomnia: Secondary | ICD-10-CM

## 2023-12-28 DIAGNOSIS — G4733 Obstructive sleep apnea (adult) (pediatric): Secondary | ICD-10-CM | POA: Diagnosis not present

## 2023-12-28 DIAGNOSIS — Z6841 Body Mass Index (BMI) 40.0 and over, adult: Secondary | ICD-10-CM

## 2023-12-28 NOTE — Progress Notes (Signed)
 Bob [name]    [id number]    2001-05-04  Primary Care Physician:Sanford, Candee Cha, MD  Referring Physician: Limmie Ren, MD [address] [place],  [place] [address]  Chief complaint:   Patient with obstructive sleep apnea  HPI:  History of severe obstructive sleep apnea on BiPAP therapy  Tolerating BiPAP well Current mask is leaking Did have a mask refitting, expecting a new mask  No significant difficulty with BiPAP Usually goes to bed about 10 PM Falls asleep easily Final wake up time about 4:30 AM Denies any dryness of his mouth in the mornings, no headaches in the mornings Tolerating BiPAP well  Mask does not leak Expecting a new mask in a few weeks  Does try to be active about 30 minutes of walking a day  Does have daytime sleepiness History of allergies   Outpatient Encounter Medications as of 12/28/2023  Medication Sig   cetirizine  (ZYRTEC ) 10 MG tablet Take 1 tablet (10 mg total) by mouth at bedtime as needed for allergies.   clotrimazole -betamethasone  (LOTRISONE ) cream Apply 1 Application topically 2 (two) times daily.   diclofenac  Sodium (VOLTAREN ) 1 % GEL Apply 4 grams topically 4 (four) times daily.   EPINEPHrine  0.3 mg/0.3 mL IJ SOAJ injection Inject 0.3 mg into the muscle as needed for anaphylaxis.   fluticasone  (FLONASE ) 50 MCG/ACT nasal spray Place 2 sprays into both nostrils daily.   HYDROcodone -acetaminophen  (NORCO/VICODIN) 5-325 MG tablet Take 1 tablet by mouth 3 (three) times daily as needed for moderate pain (pain score 4-6). To be taken after surgery   ibuprofen  (ADVIL ) 200 MG tablet Take 200-800 mg by mouth every 6 (six) hours as needed for moderate pain (pain score 4-6).   Lactase 4500 units TABS Take 1 tab prior to eating lactose containing dairy   modafinil  (PROVIGIL ) 100 MG tablet Take 1 tablet (100 mg total) by mouth daily.   Multiple Vitamin (MULTIVITAMIN PO) Take 2 tablets by mouth daily.   ondansetron  (ZOFRAN ) 4 MG tablet  Take 1 tablet (4 mg total) by mouth every 8 (eight) hours as needed for nausea or vomiting.   Semaglutide -Weight Management (WEGOVY ) 0.25 MG/0.5ML SOAJ Inject 0.25 mg into the skin once a week.   No facility-administered encounter medications on file as of 12/28/2023.    Allergies as of 12/28/2023 - Review Complete 12/28/2023  Allergen Reaction Noted   [place] Other (See Comments) 11/24/2023   [place] grass extract Other (See Comments) 11/24/2023   Black walnut flavoring agent (non-screening) Other (See Comments) 12/16/2012   Peanut-containing drug products Diarrhea 11/24/2023    Past Medical History:  Diagnosis Date   Acid reflux disease 2021   was on famotidine  but stopped after improvement   Allergy     Asthma 2007   Asthma exacerbation 11/17/2012   no current problems no inhaler   Development delay    Karyotype normal, fragile X normal   Early puberty, male 2009   at 7 years, familial   Impaired speech articulation 2011   Inadequate social skills 2011   Myopia 2010   Newborn exposure to maternal syphilis 2002   treated during preg   Obesity 2010   Pre-diabetes    Seasonal allergies 2007   Sleep apnea    uses CPAP nightly    Past Surgical History:  Procedure Laterality Date   ADENOIDECTOMY     23 years old   CARPAL TUNNEL RELEASE Right 03/30/2023   Procedure: RIGHT CARPAL TUNNEL RELEASE;  Surgeon: Wes Hamman, MD;  Location: [facility] OR;  Service: Orthopedics;  Laterality: Right;   CARPAL TUNNEL RELEASE Left 11/28/2023   Procedure: CARPAL TUNNEL RELEASE;  Surgeon: Wes Hamman, MD;  Location: [facility] OR;  Service: Orthopedics;  Laterality: Left;   TONSILECTOMY, ADENOIDECTOMY, BILATERAL MYRINGOTOMY AND TUBES     TYMPANOSTOMY TUBE PLACEMENT  2004    Family History  Problem Relation Age of Onset   Diabetes Mother    Hyperlipidemia Mother    Asthma Father    Cancer Father    Depression Father    Diabetes Father    Hyperlipidemia Father    Hypertension Father    Asthma  Sister    Food Allergy  Sister    Food Allergy  Brother    Asthma Brother    Birth defects Brother     Social History   Socioeconomic History   Marital status: Single    Spouse name: Not on file   Number of children: Not on file   Years of education: Not on file   Highest education level: Some college, no degree  Occupational History   Not on file  Tobacco Use   Smoking status: Never   Smokeless tobacco: Never  Vaping Use   Vaping status: Never Used  Substance and Sexual Activity   Alcohol use: Never   Drug use: Never   Sexual activity: Never  Other Topics Concern   Not on file  Social History Narrative   Lives with Mother and older brother Elvan Hamel. MGM helps out.    Social Drivers of Corporate investment banker Strain: Not on file  Food Insecurity: Not on file  Transportation Needs: Not on file  Physical Activity: Not on file  Stress: Not on file  Social Connections: Not on file  Intimate Partner Violence: Not on file    Review of Systems  Respiratory:  Positive for apnea. Negative for shortness of breath.   Psychiatric/Behavioral:  Positive for sleep disturbance.     Vitals:   12/28/23 0832  BP: 115/78  Pulse: 87  SpO2: 96%     Physical Exam Constitutional:      Appearance: He is obese.  HENT:     Head: Normocephalic.     Mouth/Throat:     Mouth: Mucous membranes are moist.  Eyes:     General: No scleral icterus. Cardiovascular:     Rate and Rhythm: Normal rate and regular rhythm.     Heart sounds: No murmur heard.    No friction rub.  Pulmonary:     Effort: No respiratory distress.     Breath sounds: No stridor. No wheezing or rhonchi.  Musculoskeletal:     Cervical back: No rigidity or tenderness.  Neurological:     Mental Status: He is alert.  Psychiatric:        Mood and Affect: Mood normal.    Data Reviewed: Sleep study reviewed  Recent records, recent visits reviewed  Visit with Tammy Parrott 10/07/2023 reviewed Visit with Dr.  Matilde Son 12/06/2022 reviewed  Compliance data reviewed showing excellent compliance with BiPAP about 100% Average use of 7 hours 17 minutes Auto BiPAP with a maximum IPAP of 25, minimum EPAP of 9 with pressure support of 5 He does have significant leaks with residual AHI still elevated at 18.2  Previous download did show better control of events with less sleep  Assessment:  Severe obstructive sleep apnea on BiPAP therapy  Tolerating BiPAP well - No issues with his BiPAP  He is expecting a new mask in the next few weeks  Approved for initiation of Wegovy  for weight loss  Class III obesity - Encouraged to continue weight loss efforts  Excessive daytime sleepiness - On modafinil   Plan/Recommendations: Follow-up in about 6 months  Encouraged to call with significant concerns  Continue modafinil  for daytime sleepiness  Weight loss efforts will definitely help overall health  A new mask will also help with the mask leaks   Myer Artis MD [facility] 12/28/2023, 8:38 AM  CC: Limmie Ren, MD

## 2023-12-28 NOTE — Patient Instructions (Addendum)
 Follow-up in about 6 months  Your new mask should make a difference in your BiPAP tolerance  Try to ensure that you get about 8 hours of sleep at night  Graded activities as tolerated  Good luck with your weight loss efforts  Call us  with any significant concerns

## 2024-01-02 ENCOUNTER — Ambulatory Visit: Payer: MEDICAID | Admitting: Adult Health

## 2024-01-09 ENCOUNTER — Other Ambulatory Visit (HOSPITAL_COMMUNITY): Payer: Self-pay

## 2024-01-10 ENCOUNTER — Other Ambulatory Visit (HOSPITAL_COMMUNITY): Payer: Self-pay

## 2024-01-11 ENCOUNTER — Telehealth: Payer: Self-pay | Admitting: Adult Health

## 2024-01-11 NOTE — Telephone Encounter (Signed)
 Rc'd CPAP fax for supplies from [facility]. Will fwd to Ms. Parrett to sign.

## 2024-01-19 ENCOUNTER — Other Ambulatory Visit (HOSPITAL_COMMUNITY): Payer: Self-pay

## 2024-01-19 ENCOUNTER — Other Ambulatory Visit: Payer: Self-pay

## 2024-01-23 ENCOUNTER — Telehealth: Payer: Self-pay | Admitting: Orthopaedic Surgery

## 2024-01-23 NOTE — Telephone Encounter (Signed)
 Pt states she was told by PT on 01/19/24 that she was supposed to be wearing a sling. Pt states at her last ov she was never told that and wants to know if she should be wearing it.

## 2024-02-02 NOTE — Telephone Encounter (Signed)
 Order has been sent back to [facility] 02/01/2024

## 2024-02-03 ENCOUNTER — Ambulatory Visit: Payer: MEDICAID

## 2024-02-03 ENCOUNTER — Other Ambulatory Visit (HOSPITAL_COMMUNITY): Payer: Self-pay

## 2024-02-03 VITALS — BP 121/73 | HR 101 | Wt 393.6 lb

## 2024-02-03 DIAGNOSIS — J302 Other seasonal allergic rhinitis: Secondary | ICD-10-CM

## 2024-02-03 DIAGNOSIS — E66812 Obesity, class 2: Secondary | ICD-10-CM | POA: Diagnosis not present

## 2024-02-03 DIAGNOSIS — Z0000 Encounter for general adult medical examination without abnormal findings: Secondary | ICD-10-CM

## 2024-02-03 DIAGNOSIS — Z23 Encounter for immunization: Secondary | ICD-10-CM | POA: Diagnosis not present

## 2024-02-03 DIAGNOSIS — E6609 Other obesity due to excess calories: Secondary | ICD-10-CM

## 2024-02-03 DIAGNOSIS — Z6837 Body mass index (BMI) 37.0-37.9, adult: Secondary | ICD-10-CM

## 2024-02-03 MED ORDER — WEGOVY 0.5 MG/0.5ML ~~LOC~~ SOAJ
0.5000 mg | SUBCUTANEOUS | 1 refills | Status: DC
Start: 2024-02-03 — End: 2024-03-15
  Filled 2024-02-03 – 2024-02-19 (×2): qty 2, 28d supply, fill #0

## 2024-02-03 NOTE — Assessment & Plan Note (Signed)
 Increased wegovy  to 0.5. Follow up in 6 weeks.

## 2024-02-03 NOTE — Progress Notes (Signed)
    SUBJECTIVE:   CHIEF COMPLAINT / HPI:   Weight: Pt is currently on Wegovy  0.25 weekly with no complaints. States he has noticed a decrease in appetite. Is amenable to increasing dose to 0.5 weekly.  Allergies: Pt confirmed that he has never experienced anaphylaxis. He has an Epi pen in case of an emergency. He also uses Flonase  and Zyrtec . Will follow up with his allergist.  OSA: Pt is tolerating his CPAP. Neurology manages this.  PERTINENT  PMH / PSH:  Weight Seasonal allergies Obstructive sleep apnea  OBJECTIVE:   BP 121/73   Pulse (!) 101   Wt (!) 393 lb 9.6 oz (178.5 kg)   SpO2 95%   BMI 59.85 kg/m    General: Well appearing male, NAD HEENT: Normocephalic, atraumatic Pulm: Normal work of breathing on room air Psych: Normal affect  ASSESSMENT/PLAN:   Assessment & Plan Class 2 obesity due to excess calories with body mass index (BMI) of 37.0 to 37.9 in adult, unspecified whether serious comorbidity present Increased wegovy  to 0.5. Follow up in 6 weeks. Seasonal allergies No actual history of anaphylaxis but continues to have Epi pen on hand just in case. Otherwise stable. Continue zyrtec  and flonase . Routine adult health maintenance Tdap, meningococcal, and pneumococcal vaccines given today     Jakevion Arney, DO [facility]

## 2024-02-03 NOTE — Patient Instructions (Addendum)
-   Increased Wegovy  to 0.5 - Giving tetanus, pneumonia, and meningitis vaccines today -  Please follow up in 6 weeks for Wegovy

## 2024-02-03 NOTE — Assessment & Plan Note (Addendum)
 No actual history of anaphylaxis but continues to have Epi pen on hand just in case. Otherwise stable. Continue zyrtec  and flonase .

## 2024-02-03 NOTE — Assessment & Plan Note (Signed)
 Tdap, meningococcal, and pneumococcal vaccines given today

## 2024-02-17 ENCOUNTER — Other Ambulatory Visit (HOSPITAL_COMMUNITY): Payer: Self-pay

## 2024-02-19 ENCOUNTER — Other Ambulatory Visit (HOSPITAL_COMMUNITY): Payer: Self-pay

## 2024-03-15 ENCOUNTER — Other Ambulatory Visit (HOSPITAL_COMMUNITY): Payer: Self-pay

## 2024-03-15 ENCOUNTER — Ambulatory Visit (INDEPENDENT_AMBULATORY_CARE_PROVIDER_SITE_OTHER): Payer: MEDICAID | Admitting: Student

## 2024-03-15 VITALS — BP 135/70 | HR 69 | Ht 67.0 in | Wt 384.2 lb

## 2024-03-15 DIAGNOSIS — E66813 Obesity, class 3: Secondary | ICD-10-CM | POA: Diagnosis not present

## 2024-03-15 DIAGNOSIS — Z6841 Body Mass Index (BMI) 40.0 and over, adult: Secondary | ICD-10-CM

## 2024-03-15 MED ORDER — WEGOVY 0.5 MG/0.5ML ~~LOC~~ SOAJ
0.5000 mg | SUBCUTANEOUS | 1 refills | Status: DC
  Filled 2024-03-15: qty 2, 28d supply, fill #0
  Filled 2024-04-19 – 2024-05-01 (×3): qty 2, 28d supply, fill #1

## 2024-03-15 NOTE — Patient Instructions (Signed)
 It was great to see you today!   I am keeping you at the same dose of Wegovy . You should follow up in 8 weeks for a weight check in.   No future appointments.  Please arrive 15 minutes before your appointment to ensure smooth check in process.    Please call the clinic at [phone] if your symptoms worsen or you have any concerns.  Thank you for allowing me to participate in your care, Dr. Damien Pinal [facility]

## 2024-03-15 NOTE — Assessment & Plan Note (Signed)
 9 lb weight loss in 1 month. Congratulated on success Continue at current dose of Wegovy  0.5 mg weekly  Follow up in 8 weeks for weight check  Discussed strength training to counteract muscle mass loss from GLP1 Continue step goal of 10k  Continue reducing daily sugar intake  Goal weight by 06/2024: 365 lb

## 2024-03-15 NOTE — Progress Notes (Signed)
    SUBJECTIVE:   CHIEF COMPLAINT / HPI:   Bob [name] is a 23 y.o. male presenting for weight loss follow up. Has been on wegovy  for 3 months with recent increase to 0.5 mg/week dose.   Doing well no N/V/constipation. Appetite seems to be stable. Less cravings noted.   PERTINENT  PMH / PSH: reviewed and updated.  OBJECTIVE:   BP 135/70   Pulse 69   Ht 5' 7 (1.702 m)   Wt (!) 384 lb 3.2 oz (174.3 kg)   SpO2 92%   BMI 60.17 kg/m   well-appearing, no acute distress Cardio: Regular rate, regular rhythm, no murmurs on exam. Pulm: Clear, no wheezing, no crackles. No increased work of breathing Abdominal: bowel sounds present, soft, non-tender, non-distended Extremities: no peripheral edema   ASSESSMENT/PLAN:   Assessment & Plan Class 3 severe obesity with body mass index (BMI) of 50.0 to 59.9 in adult, unspecified obesity type, unspecified whether serious comorbidity present 9 lb weight loss in 1 month. Congratulated on success Continue at current dose of Wegovy  0.5 mg weekly  Follow up in 8 weeks for weight check  Discussed strength training to counteract muscle mass loss from GLP1 Continue step goal of 10k  Continue reducing daily sugar intake  Goal weight by 06/2024: 365 lb      Damien Pinal, DO [facility]

## 2024-04-19 ENCOUNTER — Other Ambulatory Visit (HOSPITAL_COMMUNITY): Payer: Self-pay

## 2024-04-19 ENCOUNTER — Other Ambulatory Visit: Payer: Self-pay

## 2024-04-19 ENCOUNTER — Other Ambulatory Visit: Payer: Self-pay | Admitting: Adult Health

## 2024-04-19 NOTE — Telephone Encounter (Signed)
 Pt requesting refill of Provigil . Please advise - thank you!  LOV: 10/07/23

## 2024-04-20 ENCOUNTER — Other Ambulatory Visit (HOSPITAL_COMMUNITY): Payer: Self-pay

## 2024-04-20 ENCOUNTER — Telehealth: Payer: Self-pay

## 2024-04-20 NOTE — Telephone Encounter (Signed)
 Pharmacy Patient Advocate Encounter  Effective October 1st, [place] will discontinue coverage of GLP1 medications for weight loss (such as Wegovy  and Zepbound ), unless the patient has a documented history of a heart attack or stroke. Zepbound  will continue to be covered only for patients with moderate to severe sleep apnea (AHI 15-30) and a BMI greater than 40. Because of this change, the prior authorization team will not be submitting new PA requests for GLP1 medications prescribed for weight loss , as patients will be unable to continue therapy under Medicaid coverage.

## 2024-04-24 ENCOUNTER — Other Ambulatory Visit: Payer: Self-pay | Admitting: Adult Health

## 2024-04-24 ENCOUNTER — Other Ambulatory Visit (HOSPITAL_COMMUNITY): Payer: Self-pay

## 2024-04-27 ENCOUNTER — Other Ambulatory Visit (HOSPITAL_COMMUNITY): Payer: Self-pay

## 2024-05-01 ENCOUNTER — Telehealth: Payer: Self-pay

## 2024-05-01 ENCOUNTER — Other Ambulatory Visit (HOSPITAL_COMMUNITY): Payer: Self-pay

## 2024-05-01 NOTE — Telephone Encounter (Signed)
 Patients mother calls nurse line in regards to Wegovy .   Advised as of 10/1 Medicaid was no longer covering this medication. We discussed the criteria in depth.  She reports he has a hx of sleep apnea. He does follow Pulmonology for this. Advised he may be candidate for Zepbound .   Patient scheduled for 10/21 to discuss.

## 2024-05-02 ENCOUNTER — Other Ambulatory Visit (HOSPITAL_COMMUNITY): Payer: Self-pay

## 2024-05-03 ENCOUNTER — Other Ambulatory Visit (HOSPITAL_COMMUNITY): Payer: Self-pay

## 2024-05-08 ENCOUNTER — Encounter: Payer: Self-pay | Admitting: Family Medicine

## 2024-05-08 ENCOUNTER — Other Ambulatory Visit (HOSPITAL_COMMUNITY): Payer: Self-pay

## 2024-05-08 ENCOUNTER — Ambulatory Visit: Payer: MEDICAID | Admitting: Family Medicine

## 2024-05-08 ENCOUNTER — Telehealth (HOSPITAL_COMMUNITY): Payer: Self-pay

## 2024-05-08 VITALS — BP 110/70 | HR 74 | Ht 67.0 in | Wt 369.0 lb

## 2024-05-08 DIAGNOSIS — E66813 Obesity, class 3: Secondary | ICD-10-CM

## 2024-05-08 DIAGNOSIS — Z808 Family history of malignant neoplasm of other organs or systems: Secondary | ICD-10-CM

## 2024-05-08 DIAGNOSIS — Z23 Encounter for immunization: Secondary | ICD-10-CM | POA: Diagnosis not present

## 2024-05-08 DIAGNOSIS — G4733 Obstructive sleep apnea (adult) (pediatric): Secondary | ICD-10-CM

## 2024-05-08 DIAGNOSIS — Z6841 Body Mass Index (BMI) 40.0 and over, adult: Secondary | ICD-10-CM | POA: Diagnosis not present

## 2024-05-08 DIAGNOSIS — R400 Somnolence: Secondary | ICD-10-CM

## 2024-05-08 LAB — POCT GLYCOSYLATED HEMOGLOBIN (HGB A1C): Hemoglobin A1C: 5.4 % (ref 4.0–5.6)

## 2024-05-08 MED ORDER — MODAFINIL 100 MG PO TABS
100.0000 mg | ORAL_TABLET | Freq: Every day | ORAL | 3 refills | Status: AC
  Filled 2024-05-08 – 2024-06-18 (×3): qty 30, 30d supply, fill #0
  Filled 2024-07-20: qty 30, 30d supply, fill #1
  Filled 2024-08-24: qty 30, 30d supply, fill #2

## 2024-05-08 MED ORDER — TIRZEPATIDE-WEIGHT MANAGEMENT 2.5 MG/0.5ML ~~LOC~~ SOAJ
2.5000 mg | SUBCUTANEOUS | 1 refills | Status: AC
  Filled 2024-05-08 – 2024-05-25 (×4): qty 2, 28d supply, fill #0

## 2024-05-08 NOTE — Assessment & Plan Note (Signed)
 Starting Zepbound  for treatment of OSA and obesity. - Starting at 2.5 mg - Follow-up in 1 month

## 2024-05-08 NOTE — Patient Instructions (Signed)
 It was wonderful to see you today.  Please bring ALL of your medications with you to every visit.   Today we talked about:  Weight loss medication - We are switching you to zepbound . This is also to help with your sleep apnea. If you have any side effects let me know. I have placed a genetics referral given your sister's thyroid  cancer. She had a different type than what was noted in the studies. However, I will also check your thyroid  labs today.  For your dry skin use either Amlactin or a cream with urea in it. You can apply vaseline over top.   Please follow up in 1 month   Thank you for choosing [facility].   Please call [phone] with any questions about today's appointment.  Please be sure to schedule follow up at the front desk before you leave today.   Areta Saliva, MD  Family Medicine

## 2024-05-08 NOTE — Progress Notes (Signed)
    SUBJECTIVE:   CHIEF COMPLAINT / HPI:   Weight loss medication  Patient's insurance stopped paying for Wegovy  at the beginning of October.  Patient had been doing well on this for 2 months.  No side effects.  He does have a history of sleep apnea and uses a CPAP regularly and is followed by pulmonology for this.  He is interested in Zepbound .  His sister did have papillary thyroid  cancer.  Otherwise no other history of thyroid  cancer in the family.  Patient does not have a history of thyroid  disease.  PERTINENT  PMH / PSH: Sleep apnea, obesity  OBJECTIVE:   BP 110/70   Pulse 74   Ht 5' 7 (1.702 m)   Wt (!) 369 lb (167.4 kg)   SpO2 98%   BMI 57.79 kg/m    General: well appearing, in no acute distress RC:tzoo perfused  Resp: Normal work of breathing on room air   ASSESSMENT/PLAN:   Assessment & Plan Class 3 severe obesity with body mass index (BMI) of 50.0 to 59.9 in adult, unspecified obesity type, unspecified whether serious comorbidity present (HCC) Obstructive sleep apnea Starting Zepbound  for treatment of OSA and obesity. - Starting at 2.5 mg - Follow-up in 1 month Family history of thyroid  cancer Given patient has started GLP he was counseled on very rare side effect of increasing risk of thyroid  cancer specifically medullary thyroid  cancer.  Patient does have sister who was diagnosed with papillary thyroid  cancer at age 60.  Mom and patient were concerned and thus referred to genetic counseling. - Genetic counseling referral - TSH today.     Areta Saliva, MD [facility]

## 2024-05-09 ENCOUNTER — Ambulatory Visit: Payer: Self-pay | Admitting: Family Medicine

## 2024-05-09 ENCOUNTER — Other Ambulatory Visit (HOSPITAL_COMMUNITY): Payer: Self-pay

## 2024-05-09 LAB — TSH RFX ON ABNORMAL TO FREE T4: TSH: 1.19 u[IU]/mL (ref 0.450–4.500)

## 2024-05-16 ENCOUNTER — Other Ambulatory Visit (HOSPITAL_COMMUNITY): Payer: Self-pay

## 2024-05-16 ENCOUNTER — Encounter (HOSPITAL_COMMUNITY): Payer: Self-pay

## 2024-05-16 ENCOUNTER — Other Ambulatory Visit: Payer: Self-pay

## 2024-05-21 ENCOUNTER — Encounter: Payer: Self-pay | Admitting: Radiology

## 2024-05-21 ENCOUNTER — Telehealth (HOSPITAL_COMMUNITY): Payer: Self-pay

## 2024-05-21 ENCOUNTER — Other Ambulatory Visit (HOSPITAL_COMMUNITY): Payer: Self-pay

## 2024-05-21 DIAGNOSIS — G4733 Obstructive sleep apnea (adult) (pediatric): Secondary | ICD-10-CM

## 2024-05-21 NOTE — Telephone Encounter (Signed)
 PA request has been Received. New Encounter has been or will be created for follow up. For additional info see Pharmacy Prior Auth telephone encounter from 05/21/24.

## 2024-05-21 NOTE — Telephone Encounter (Signed)
 Pharmacy Patient Advocate Encounter   Received notification from Pt Calls Messages that prior authorization for Zepbound  2.5 mg/0.5 ml auto injectors is required/requested.   Insurance verification completed.   The patient is insured through [place] MEDICAID.   Per test claim: Effective October 1st, Medicaid discontinued coverage of GLP1 medications for weight loss (such as Wegovy  and Zepbound ), unless the patient has a documented history of a heart attack or stroke. Zepbound  will continue to be covered only for patients with moderate to severe sleep apnea (AHI 15-30) and a BMI greater than 40. Because of this change, the prior authorization team will not be submitting new PA requests for GLP1 medications prescribed for weight loss, as patients will be unable to continue therapy under Medicaid coverage.   *I see that she does have OSA but her AHI has to be 15 or above and based on chart notes it is only 6

## 2024-05-22 ENCOUNTER — Telehealth: Payer: Self-pay | Admitting: Family Medicine

## 2024-05-22 NOTE — Addendum Note (Signed)
 Addended by: Talyn Eddie on: 05/22/2024 08:46 AM   Modules accepted: Orders

## 2024-05-22 NOTE — Telephone Encounter (Signed)
 Attempted to call patient in regards to test claim for zepbound . However pattient was not available. I left a HIPAA compliant vm. Patient would not qualify though he has sleep apnea because his AHI is < 15. I am going to put in an order for a sleep titration study as it has been 5 years since last study.

## 2024-05-25 ENCOUNTER — Other Ambulatory Visit (HOSPITAL_COMMUNITY): Payer: Self-pay

## 2024-05-25 ENCOUNTER — Telehealth: Payer: Self-pay | Admitting: Family Medicine

## 2024-05-25 ENCOUNTER — Other Ambulatory Visit: Payer: Self-pay | Admitting: Family Medicine

## 2024-05-25 DIAGNOSIS — G4733 Obstructive sleep apnea (adult) (pediatric): Secondary | ICD-10-CM

## 2024-05-25 NOTE — Telephone Encounter (Signed)
 Pharmacy Patient Advocate Encounter  Received notification from [place] [employer] that Prior Authorization for Zepbound  2.5 mg/0.5 ml auto injectors  has been APPROVED from 05/25/24 to 11/22/24. Ran test claim, Copay is $4. This test claim was processed through [facility]- copay amounts may vary at other pharmacies due to pharmacy/plan contracts, or as the patient moves through the different stages of their insurance plan.   PA #/Case ID/Reference #: [id number]

## 2024-05-25 NOTE — Telephone Encounter (Signed)
 Talked to patient's mom regarding his zepbound  prescription. It was denied because patient's AHI found to be < 15. However, in chart review has been > 15 at 18.2 previously. I had ordered another sleep study, but Mom said it improved with wegovy . However, worried that his OSA will worsen now that he is off GLP 1. Will reach out to PA team regarding the AHI in the chart being > 15.

## 2024-05-25 NOTE — Telephone Encounter (Signed)
 Mother returns call to nurse line.   Provided with information per note from Dr. Nicholas.   Provided mother with information for sleep center to schedule follow up sleep study.   Bob JAYSON English, RN

## 2024-05-25 NOTE — Telephone Encounter (Signed)
 Pharmacy Patient Advocate Encounter   Received notification from Pt Calls Messages that prior authorization for Zepbound  2.5 mg/0.5 ml auto injectors  is required/requested.   Insurance verification completed.   The patient is insured through [place] MEDICAID.   Per test claim: PA required; PA submitted to above mentioned insurance via Latent Key/confirmation #/EOC [id number] Status is pending

## 2024-05-28 ENCOUNTER — Other Ambulatory Visit (HOSPITAL_COMMUNITY): Payer: Self-pay

## 2024-05-29 NOTE — Addendum Note (Signed)
 Addended by: Tamrah Victorino on: 05/29/2024 02:57 PM   Modules accepted: Orders

## 2024-05-31 NOTE — Telephone Encounter (Signed)
 Called [facility] and cancelled sleep study.   Bob JAYSON English, RN

## 2024-06-08 ENCOUNTER — Other Ambulatory Visit (HOSPITAL_COMMUNITY): Payer: Self-pay

## 2024-06-08 ENCOUNTER — Ambulatory Visit (INDEPENDENT_AMBULATORY_CARE_PROVIDER_SITE_OTHER): Payer: MEDICAID

## 2024-06-08 VITALS — BP 137/89 | HR 72 | Ht 67.0 in | Wt 365.8 lb

## 2024-06-08 DIAGNOSIS — R400 Somnolence: Secondary | ICD-10-CM

## 2024-06-08 DIAGNOSIS — G4733 Obstructive sleep apnea (adult) (pediatric): Secondary | ICD-10-CM

## 2024-06-08 DIAGNOSIS — M25561 Pain in right knee: Secondary | ICD-10-CM

## 2024-06-08 MED ORDER — ZEPBOUND 5 MG/0.5ML ~~LOC~~ SOAJ
5.0000 mg | SUBCUTANEOUS | 1 refills | Status: DC
  Filled 2024-06-08 – 2024-06-18 (×2): qty 2, 28d supply, fill #0
  Filled 2024-07-18: qty 2, 28d supply, fill #1

## 2024-06-08 NOTE — Patient Instructions (Signed)
 Thank you for visiting the clinic today, it was good to see you!  Our plans for today: - Increase Zepbound  to 5mg  after 2 weeks - Use tylenol  or ibuprofen  as needed for knee pain - Use knee stretches provided before and after exercising - incorporate strength training - incorporate vegetables into diet  Please follow-up in 8 weeks. Make sure to bring ALL of your medications with you to every visit.   Please arrive 15 minutes PRIOR to your next scheduled appointment time! If you do not, this affects OTHER patients' care.  For any questions, please call the office at [phone] or send me a message in MyChart.  It was a pleasure to take care of you today. Have a great day!  Adolph Clutter, DO [facility] Resident, PGY-1  -----------------------------------------------------------------------------  Do you need your medications delivered to your home?   We'll send your prescription to the  [facility] for delivery.          Address: [address] [place], [place], [place] [address]          Phone: [phone]  Please call the [facility] to speak with a pharmacist and set up your home medication delivery. If you have any questions, feel free to contact us  -- we're happy to help!  Other [place] Pharmacies that offer affordable prices on both prescriptions and over-the-counter items, as well as convenient services like vaccinations, are  [facility], at [facility]         Address: [address], [place], [place] [address]         Phone: [phone]  [facility], located in the [facility]        Address: [address] [place], [place] [address]        Phone: [phone]  [facility], at [facility]       Address: [address], [place], [place] [address]       Phone: [phone]  [facility], at [facility]       Address: [address] Floor, [place], [place] [address]       Phone: [phone]  -----------------------------------------------------------------------------  Food Resources SNAP/ Food benefits - [place] [phone] [place]: [address] [place]: [address] [facility]) Nutrition program [place]: [phone] [place]: [phone] [facility] Helping [facility] - Free Produce & Food (Every Thursday 9AM to 11:00AM) [employer] [phone] [address], [place], [place]: [address] [phone] [place]: [address] [phone]  The [facility] [employer] Health app, formally [employer], connects those living in [place], [place] & surrounding areas with healthy food options & access to emergency resources. This app aims to alleviate some of the barriers to food access by making the many ways people in [place], [place] & surrounding areas can access food resources publicly available.    This app is the product of the [employer], whose mission is to coordinate and improve the effectiveness of entities in [place] [employer] focused on alleviating hunger by creating and executing citywide and neighborhood-focused initiatives to develop more just and sustainable food systems.

## 2024-06-08 NOTE — Progress Notes (Signed)
    SUBJECTIVE:   CHIEF COMPLAINT / HPI:   Bob [name] is a 23 y.o.male who presents for f/u after starting Zepbound  2.5 mg on 11/10. He is accompanied by his mother. Reports no changes in appetite with current dose. Notes some constipation. No nausea/vomiting.   Also c/o R knee pain x 2 weeks. Pain is described as a dull ache that he notices when walking, especially when going upstairs. Has not tried anything for it. States this has happened before and resolved on its own. Denies any injuries.  PERTINENT  PMH / PSH: obesity, OSA  OBJECTIVE:   BP 137/89   Pulse 72   Ht 5' 7 (1.702 m)   Wt (!) 365 lb 12.8 oz (165.9 kg)   SpO2 95%   BMI 57.29 kg/m    General: Alert, well-appearing male in NAD.  Cardiovascular: RRR, no m/r/g appreciated. Pulmonary: Normal WOB. CTAB with no w/c/r present.  Abdomen: Soft, non-tender, non-distended. Extremities: Warm and well-perfused, without cyanosis or edema. Knee: normal knee contour, no joint line tenderness, patella appropriately located within patellofemoral groove. NO apparent edema or erythema. Full, pain free ROM. 5/5 strength bilaterally. Neurologic: Normal gait, moves all four extremities appropriately Skin: No rashes or lesions. Psych: Appropriate mood and affect  ASSESSMENT/PLAN:   Assessment & Plan Obstructive sleep apnea Daytime somnolence Starting weight 369 lbs, now 365 lb 12.8 oz. Congratulated on success. Reports less daytime somnolence. Requesting refill for Provigil . Recommend following up with sleep medicine doctor who prescribes it as it is a controlled substance. - Continue current dose of Zepbound  2.5 mg for 2 more weeks - Rx sent for Zepbound  5 mg to start after - F/u scheduled for 08/21/23 at 9:25 for weight check - Discussed strength training to counteract muscle mass loss from GLP1 - Discussed dietary changes - Continue exercising Acute pain of right knee Benign exam. Low concern for acute pathology. Likely knee  strain from exercise. - Recommend tyelnol or ibuprofen  as needed - Provided handout of exercises to perform daily for strengthening - Return if sx do not improve or worsen    Darren Jernigan, DO [facility]

## 2024-06-08 NOTE — Assessment & Plan Note (Signed)
 Starting weight 369 lbs, now 365 lb 12.8 oz. Congratulated on success. Reports less daytime somnolence. Requesting refill for Provigil . Recommend following up with sleep medicine doctor who prescribes it as it is a controlled substance. - Continue current dose of Zepbound  2.5 mg for 2 more weeks - Rx sent for Zepbound  5 mg to start after - F/u scheduled for 08/21/23 at 9:25 for weight check - Discussed strength training to counteract muscle mass loss from GLP1 - Discussed dietary changes - Continue exercising

## 2024-06-17 NOTE — Patient Instructions (Incomplete)
 Bob [name] was not able to tolerate the peanut butter food challenge today at the office without adverse sighn or symptoms of an allergic reaction.   Continue to avoid peanut/peanut products. In case of an allergic reaction, give Zyrtec  10 mg every 24 hours , and if life-threatening symptoms occur, inject with EpiPen  0.3 mg.  CPT codes given to call insurance to find out the cost of allergy  injections.  Please call our office if interested in starting allergy  injections.  Follow-up in 2 to 3 months or sooner if needed

## 2024-06-18 ENCOUNTER — Other Ambulatory Visit (HOSPITAL_COMMUNITY): Payer: Self-pay

## 2024-06-18 ENCOUNTER — Other Ambulatory Visit: Payer: Self-pay

## 2024-06-18 ENCOUNTER — Encounter: Payer: Self-pay | Admitting: Family

## 2024-06-18 ENCOUNTER — Ambulatory Visit: Payer: MEDICAID | Admitting: Family

## 2024-06-18 VITALS — BP 120/70 | HR 72 | Temp 97.9°F | Resp 18 | Wt 365.0 lb

## 2024-06-18 DIAGNOSIS — R109 Unspecified abdominal pain: Secondary | ICD-10-CM | POA: Diagnosis not present

## 2024-06-18 DIAGNOSIS — K5229 Other allergic and dietetic gastroenteritis and colitis: Secondary | ICD-10-CM | POA: Diagnosis not present

## 2024-06-18 NOTE — Progress Notes (Signed)
 [address] [place] [address] Dept: [phone]  FOLLOW UP NOTE  Patient ID: Bob [name], male    DOB: [birth year]  Age: 23 y.o. MRN: [id number] Date of Office Visit: 06/18/2024  Assessment  Chief Complaint: Food/Drug Challenge (pb)  HPI Bob [name] is a 23 year old male who presents today for an oral food challenge to peanut butter.  He was last seen by myself on November 14, 2023 for seasonal and perennial allergic rhinitis and adverse food reaction.  His mom is here with him today and helps provide history.  She reports that since his last office visit sometime in May he had a carpal tunnel surgery on either his left or his right hand.  He reports that he has multiple rounds of diarrhea around 30 minutes after eating peanut products.  He thinks he may have had abdominal pain also.  He denies any other cardiorespiratory or cutaneous symptoms.  Discussed FPIES (food protein induced enterocolitis) versus IgE mediated food allergy .  Also discussed the difference on treatments between FPIES and IgE-mediated food allergy .  He would still like to proceed with challenge today.  He reports that he has been off all antihistamines for the past 3 days and is in good health.  He denies any cardiorespiratory, gastrointestinal, or cutaneous symptoms.  He has not received any antibiotics or vaccines in the past 7 days.  Opportunity given to ask questions.  Informed consent signed.  Mom is potentially interested in starting allergy  injections to help with his allergies.  She does not remember if they ever received CPT codes to find out the cost of allergy  injections.  Discussed Rush versus traditional immunotherapy.  Drug Allergies:  Allergies  Allergen Reactions   Bahia Other (See Comments)    Skin irritation   Bermuda Grass Extract Other (See Comments)    Skin Irritation   Black Buyer, Retail (Non-Screening) Other (See Comments)    Asphyxiation   Peanut-Containing Drug Products  Diarrhea    Review of Systems: Negative except as per HPI  Physical Exam: BP 120/70   Pulse 72   Temp 97.9 F (36.6 C) (Temporal)   Resp 18   Wt (!) 365 lb (165.6 kg)   SpO2 98%   BMI 57.17 kg/m    Physical Exam Constitutional:      Appearance: Normal appearance.  HENT:     Head: Normocephalic and atraumatic.     Comments: Pharynx normal. Eyes normal. Ears normal. Nose: bilateral  lower turbinates mildly edematous with no drainage noted.    Right Ear: Tympanic membrane, ear canal and external ear normal.     Left Ear: Tympanic membrane, ear canal and external ear normal.     Mouth/Throat:     Mouth: Mucous membranes are moist.     Pharynx: Oropharynx is clear.  Eyes:     Conjunctiva/sclera: Conjunctivae normal.  Cardiovascular:     Rate and Rhythm: Regular rhythm.     Heart sounds: Normal heart sounds.  Pulmonary:     Effort: Pulmonary effort is normal.     Breath sounds: Normal breath sounds.     Comments: Lungs clear to auscultation Musculoskeletal:     Cervical back: Neck supple.  Skin:    General: Skin is warm.     Comments: No rashes or urticarial lesions noted  Neurological:     Mental Status: He is alert and oriented to person, place, and time.  Psychiatric:        Mood and Affect:  Mood normal.        Behavior: Behavior normal.        Thought Content: Thought content normal.        Judgment: Judgment normal.     Diagnostics:  Open graded peanut butter oral challenge: The patient was not able to tolerate the challenge today without adverse signs or symptoms. Vital signs were stable throughout the challenge and observation period. He received multiple doses separated by 15 minutes, each of which was separated by vitals and a brief physical exam. He received the following doses: lip rub, 1 gm, and repeat 1 gm. He was monitored for 60 minutes following the last dose.  At 2:08 PM he reports slight abdominal pain on the left side.  He denies any concomitant  cardiorespiratory or cutaneous symptoms.  He also denies nausea, vomiting, and diarrhea.  At 2:21 PM he reports he is no longer having abdominal pain.  Discussed either stopping the challenge or repeating 1 g dose and he would like to repeat the 1 g dose.  At 2:32 PM he reports the sensation of needing to have a bowel movement, but does not need to go now.  He denies nausea, vomiting, diarrhea, cardiorespiratory, or cutaneous symptoms.  Physical exam and vitals stable.  Challenge stopped.  At 2:44 PM he reports the sensation to have a bowel movement is stronger, but does not wish to use the bathroom.  He denies any concomitant cardiorespiratory or cutaneous symptoms.  Physical exam and vitals stable.  At 2:53 PM he reports that he needs to go to the bathroom.  Unable to have a bowel movement.  He denies any concomitant cardiorespiratory or cutaneous symptoms.  At 2:58 PM he did not have a bowel movement and reports that he no longer has the urge to go and denies any cardiorespiratory, gastrointestinal, cutaneous symptoms.  At discharge she denies any cardiorespiratory, gastrointestinal, and cutaneous symptoms.  Physical exam normal vitals normal.  The patient had negative skin prick test and sIgE tests to peanut and was not able to tolerate the open graded oral challenge today without adverse signs or symptoms.   Assessment and Plan: 1. 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 Abdominal pain, unspecified abdominal location     No orders of the defined types were placed in this encounter.   Patient Instructions  Bob [name] was not able to tolerate the peanut butter food challenge today at the office without adverse sighn or symptoms of an allergic reaction.   Continue to avoid peanut/peanut products. In case of an allergic reaction, give Zyrtec  10 mg every 24 hours , and if life-threatening symptoms occur, inject with EpiPen  0.3 mg.  CPT codes given to call insurance to find out the cost of allergy   injections.  Please call our office if interested in starting allergy  injections.  Follow-up in 2 to 3 months or sooner if needed     Return in about 3 months (around 09/16/2024), or if symptoms worsen or fail to improve.    Thank you for the opportunity to care for this patient.  Please do not hesitate to contact me with questions.  Wanda Craze, FNP [facility] 

## 2024-06-30 ENCOUNTER — Encounter: Payer: Self-pay | Admitting: Internal Medicine

## 2024-07-20 ENCOUNTER — Other Ambulatory Visit (HOSPITAL_COMMUNITY): Payer: Self-pay

## 2024-07-20 ENCOUNTER — Telehealth (HOSPITAL_COMMUNITY): Payer: Self-pay

## 2024-07-23 ENCOUNTER — Other Ambulatory Visit (HOSPITAL_COMMUNITY): Payer: Self-pay

## 2024-07-23 NOTE — Telephone Encounter (Signed)
 Medicaid only pays for brand name, Bob [name] is ordering it.

## 2024-07-25 ENCOUNTER — Other Ambulatory Visit (HOSPITAL_COMMUNITY): Payer: Self-pay

## 2024-07-25 MED ORDER — PREDNISONE 20 MG PO TABS
40.0000 mg | ORAL_TABLET | Freq: Every day | ORAL | 0 refills | Status: AC
  Filled 2024-07-25: qty 4, 2d supply, fill #0

## 2024-07-25 MED ORDER — MONTELUKAST SODIUM 10 MG PO TABS
10.0000 mg | ORAL_TABLET | Freq: Every day | ORAL | 0 refills | Status: AC
  Filled 2024-07-25: qty 2, 2d supply, fill #0

## 2024-07-25 MED ORDER — FAMOTIDINE 20 MG PO TABS
20.0000 mg | ORAL_TABLET | Freq: Two times a day (BID) | ORAL | 0 refills | Status: AC
  Filled 2024-07-25: qty 4, 2d supply, fill #0

## 2024-07-30 ENCOUNTER — Other Ambulatory Visit (HOSPITAL_COMMUNITY): Payer: Self-pay

## 2024-07-30 ENCOUNTER — Other Ambulatory Visit: Payer: Self-pay | Admitting: Internal Medicine

## 2024-07-30 DIAGNOSIS — J3089 Other allergic rhinitis: Secondary | ICD-10-CM

## 2024-07-30 DIAGNOSIS — J301 Allergic rhinitis due to pollen: Secondary | ICD-10-CM | POA: Diagnosis not present

## 2024-07-30 DIAGNOSIS — J3081 Allergic rhinitis due to animal (cat) (dog) hair and dander: Secondary | ICD-10-CM | POA: Diagnosis not present

## 2024-07-30 NOTE — Progress Notes (Signed)
 Aeroallergen Immunotherapy  Ordering Provider: Hargis Springer, MD  Patient Details Name: Bob [name] MRN: [id number] Date of Birth: [birth year]  Order 1 of 2  Vial Label: G-W-T-D  0.3 ml (Volume)  BAU Concentration -- 7 Grass Mix* 100,000 ([place]  Blue, Vining, Media, Perennial Rye, RedTop, Sweet Vernal, Timothy) 0.2 ml (Volume)  1:20 Concentration -- Bahia 0.3 ml (Volume)  BAU Concentration -- Bermuda 10,000 0.2 ml (Volume)  1:20 Concentration -- Johnson 0.3 ml (Volume)  1:20 Concentration -- Ragweed Mix 0.2 ml (Volume)  1:20 Concentration -- Marsh elder, Rough* 0.2 ml (Volume)  1:10 Concentration -- Cedar, red 0.3 ml (Volume)  1:10 Concentration -- Oak, Eastern mix* 0.2 ml (Volume)  1:10 Concentration -- Sycamore Eastern* 0.5 ml (Volume)  1:10 Concentration -- Dog Epithelia   2.7  ml Extract Subtotal 2.3  ml Diluent  5.0  ml Maintenance Total  Schedule:  RUSH Silver Vial (1:10,000): RUSH Green Vial (1:1,000): RUSH Blue Vial (1:100): RUSH Yellow Vial (1:10): Schedule B (6 doses) Red Vial (1:1): Schedule A (14 doses)  Special Instructions: After completion of the first Red Vial, please space to every two weeks. After completion of the second Red Vial, please space to every 4 weeks. Ok to up dose new vials on Schedule D. Ok to come twice weekly, if desired, as long as there is 48 hours between injections.

## 2024-07-30 NOTE — Progress Notes (Signed)
 Aeroallergen Immunotherapy  Ordering Provider: Hargis Springer, MD  Patient Details Name: Bob [name] MRN: [id number] Date of Birth: 09/29/2000  Order 2 of 2  Vial Label: M-Dm-C  0.2 ml (Volume)  1:10 Concentration -- Aspergillus mix 0.2 ml (Volume)  1:10 Concentration -- Penicillium mix 0.2 ml (Volume)  1:20 Concentration -- Bipolaris sorokiniana 0.2 ml (Volume)  1:20 Concentration -- Drechslera spicifera 0.2 ml (Volume)  1:10 Concentration -- Mucor plumbeus 0.2 ml (Volume)  1:10 Concentration -- Fusarium moniliforme 0.2 ml (Volume)  1:40 Concentration -- Aureobasidium pullulans 0.2 ml (Volume)  1:10 Concentration -- Rhizopus oryzae 0.5 ml (Volume)   AU Concentration -- Mite Mix (DF 5,000 & DP 5,000) 0.5 ml (Volume)  1:10 Concentration -- Cat Hair   2.6  ml Extract Subtotal 2.4  ml Diluent  5.0  ml Maintenance Total  Schedule:  RUSH Silver Vial (1:10,000): RUSH Green Vial (1:1,000): RUSH Blue Vial (1:100): RUSH Yellow Vial (1:10): Schedule B (6 doses) Red Vial (1:1): Schedule A (14 doses)  Special Instructions: After completion of the first Red Vial, please space to every two weeks. After completion of the second Red Vial, please space to every 4 weeks. Ok to up dose new vials on Schedule D. Ok to come twice weekly, if desired, as long as there is 48 hours between injections.

## 2024-07-30 NOTE — Progress Notes (Signed)
 VIALS MADE ON 07/30/24

## 2024-07-31 ENCOUNTER — Other Ambulatory Visit (HOSPITAL_COMMUNITY): Payer: Self-pay

## 2024-07-31 DIAGNOSIS — J3081 Allergic rhinitis due to animal (cat) (dog) hair and dander: Secondary | ICD-10-CM | POA: Diagnosis not present

## 2024-07-31 DIAGNOSIS — J302 Other seasonal allergic rhinitis: Secondary | ICD-10-CM | POA: Diagnosis not present

## 2024-07-31 DIAGNOSIS — J3089 Other allergic rhinitis: Secondary | ICD-10-CM | POA: Diagnosis not present

## 2024-08-03 ENCOUNTER — Encounter: Payer: Self-pay | Admitting: Internal Medicine

## 2024-08-03 ENCOUNTER — Ambulatory Visit: Payer: MEDICAID | Admitting: Internal Medicine

## 2024-08-03 VITALS — BP 124/74 | HR 86 | Temp 98.4°F | Resp 17

## 2024-08-03 DIAGNOSIS — J302 Other seasonal allergic rhinitis: Secondary | ICD-10-CM | POA: Diagnosis not present

## 2024-08-03 DIAGNOSIS — J3089 Other allergic rhinitis: Secondary | ICD-10-CM

## 2024-08-03 NOTE — Progress Notes (Signed)
 "  RAPID DESENSITIZATION Note  RE: Bob [name] MRN: [id number] DOB: 2001/02/01 Date of Office Visit: 08/03/2024  Subjective:  Patient presents today for rapid desensitization.  Interval History: Patient has not been ill, he has taken all premedications as per protocol.  Recent/Current History: Pulmonary disease: no Cardiac disease: no Respiratory infection: no Rash: no Itch: no Swelling: no Cough: no Shortness of breath: no Runny/stuffy nose: no Itchy eyes: no Beta-blocker use: N/A  Patient/guardian was informed of the procedure with verbalized understanding of the risk of anaphylaxis. Consent has been signed.   Medication List:  Current Outpatient Medications  Medication Sig Dispense Refill   cetirizine  (ZYRTEC ) 10 MG tablet Take 1 tablet (10 mg total) by mouth at bedtime as needed for allergies. 90 tablet 1   EPINEPHrine  0.3 mg/0.3 mL IJ SOAJ injection Inject 0.3 mg into the muscle as needed for anaphylaxis. 2 each 1   famotidine  (PEPCID ) 20 MG tablet Take 1 tablet (20 mg total) by mouth 2 (two) times daily day before and day of RUSH appointment. 4 tablet 0   fluticasone  (FLONASE ) 50 MCG/ACT nasal spray Place 2 sprays into both nostrils daily. 16 g 6   ibuprofen  (ADVIL ) 200 MG tablet Take 200-800 mg by mouth every 6 (six) hours as needed for moderate pain (pain score 4-6). (Patient not taking: Reported on 06/08/2024)     modafinil  (PROVIGIL ) 100 MG tablet Take 1 tablet (100 mg total) by mouth daily. 30 tablet 3   montelukast  (SINGULAIR ) 10 MG tablet Take 1 tablet (10 mg total) by mouth day before and day of RUSH appt. 2 tablet 0   Multiple Vitamin (MULTIVITAMIN PO) Take 2 tablets by mouth daily.     predniSONE  (DELTASONE ) 20 MG tablet Take 2 tablets (40 mg total) by mouth day before and day of RUSH appt. 4 tablet 0   tirzepatide  (ZEPBOUND ) 2.5 MG/0.5ML Pen Inject 2.5 mg into the skin once a week. 6 mL 1   tirzepatide  (ZEPBOUND ) 5 MG/0.5ML Pen Inject 5 mg into the skin once a  week. 2 mL 1   No current facility-administered medications for this visit.   Allergies: Allergies[1] I reviewed his past medical history, social history, family history, and environmental history and no significant changes have been reported from his previous visit.  ROS: Negative except as per HPI.  Objective: BP 118/68   Pulse 80   Temp 98.7 F (37.1 C) (Temporal)   Resp 18   SpO2 97%  There is no height or weight on file to calculate BMI.   General Appearance:  Alert, cooperative, no distress, appears stated age  Head:  Normocephalic, without obvious abnormality, atraumatic  Eyes:  Conjunctiva clear, EOM's intact  Nose: Nares normal  Throat: Lips, tongue normal; teeth and gums normal, normal posterior oropharnyx  Neck: Supple, symmetrical  Lungs:   CTAB, Respirations unlabored, no coughing  Heart:  Appears well perfused  Extremities: No edema  Skin: Skin color, texture, turgor normal, no rashes or lesions on visualized portions of skin  Neurologic: No gross deficits     Diagnostics:  PROCEDURES:  Step 1:  0.40ml - 1:10,000 dilution (silver vial) Step 2:  0.52ml - 1:10,000 dilution (silver vial) Step 3: 0.1ml - 1:1,000 dilution (green vial)  Step 4: 0.3ml - 1:1,000 dilution (green vial)  Step 5: 0.67ml - 1:100 dilution (blue vial) Step 6: 0.2ml - 1:100 dilution (blue vial) Step 7: 0.53ml - 1:100 dilution (blue vial) Step 8: 0.19ml - 1:100 dilution (blue vial)  Patient  was observed for 1 hour after the last dose.   Procedure started at 8:35 AM Procedure ended at 4:08 PM   ASSESSMENT/PLAN:   Patient has tolerated the rapid desensitization protocol.  Next appointment: Start at 0.05ml of 1:10 dilution (yellow vial) and build up per protocol.     [1]  Allergies Allergen Reactions   Bahia Other (See Comments)    Skin irritation   Bermuda Grass Extract Other (See Comments)    Skin Irritation   Black Buyer, Retail (Non-Screening) Other (See Comments)     Asphyxiation   Peanut-Containing Drug Products Diarrhea   "

## 2024-08-10 ENCOUNTER — Ambulatory Visit (INDEPENDENT_AMBULATORY_CARE_PROVIDER_SITE_OTHER): Payer: MEDICAID

## 2024-08-10 DIAGNOSIS — J302 Other seasonal allergic rhinitis: Secondary | ICD-10-CM | POA: Diagnosis not present

## 2024-08-15 ENCOUNTER — Other Ambulatory Visit: Payer: Self-pay

## 2024-08-17 ENCOUNTER — Telehealth: Payer: Self-pay

## 2024-08-17 ENCOUNTER — Ambulatory Visit: Payer: MEDICAID

## 2024-08-17 DIAGNOSIS — J302 Other seasonal allergic rhinitis: Secondary | ICD-10-CM | POA: Diagnosis not present

## 2024-08-17 NOTE — Telephone Encounter (Signed)
 Called patient to follow up on question he regarding his shot schedule. Pt lost the sheet he received when he started. Explained to patient that he will be weekly until he gets to his red vial. Patient has at least 4 more doses in his current vial (yellow) and then he will move to the Red. Advised patient he then will be weekly but on a slower schedule so this will last several weeks. Advised patient this can be overwhelming for patients to keep up with so we advise at each shot appt what to do next. Pt states he understands and has no further questions. Advised that he call if he has any more .

## 2024-08-18 ENCOUNTER — Other Ambulatory Visit (HOSPITAL_COMMUNITY): Payer: Self-pay

## 2024-08-18 MED ORDER — ZEPBOUND 5 MG/0.5ML ~~LOC~~ SOAJ
5.0000 mg | SUBCUTANEOUS | 1 refills | Status: AC
  Filled 2024-08-18: qty 2, 28d supply, fill #0

## 2024-08-20 ENCOUNTER — Ambulatory Visit: Payer: Self-pay

## 2024-08-23 ENCOUNTER — Ambulatory Visit: Payer: MEDICAID | Admitting: *Deleted

## 2024-08-23 DIAGNOSIS — J3089 Other allergic rhinitis: Secondary | ICD-10-CM

## 2024-08-24 ENCOUNTER — Other Ambulatory Visit (HOSPITAL_COMMUNITY): Payer: Self-pay

## 2024-08-27 ENCOUNTER — Ambulatory Visit: Payer: MEDICAID

## 2024-09-24 ENCOUNTER — Ambulatory Visit: Payer: MEDICAID | Admitting: Family
